# Patient Record
Sex: Female | Born: 1965
Health system: Southern US, Community
[De-identification: ages and names within clinical notes are randomized; demographics above are authoritative.]

## PROBLEM LIST (undated history)

## (undated) DIAGNOSIS — I639 Cerebral infarction, unspecified: Secondary | ICD-10-CM

## (undated) DIAGNOSIS — F419 Anxiety disorder, unspecified: Secondary | ICD-10-CM

## (undated) DIAGNOSIS — F32A Depression, unspecified: Secondary | ICD-10-CM

## (undated) DIAGNOSIS — R519 Headache, unspecified: Secondary | ICD-10-CM

## (undated) DIAGNOSIS — J189 Pneumonia, unspecified organism: Secondary | ICD-10-CM

## (undated) DIAGNOSIS — M1990 Unspecified osteoarthritis, unspecified site: Secondary | ICD-10-CM

## (undated) DIAGNOSIS — F329 Major depressive disorder, single episode, unspecified: Secondary | ICD-10-CM

## (undated) DIAGNOSIS — E119 Type 2 diabetes mellitus without complications: Secondary | ICD-10-CM

## (undated) DIAGNOSIS — R51 Headache: Secondary | ICD-10-CM

## (undated) HISTORY — DX: Type 2 diabetes mellitus without complications: E11.9

## (undated) HISTORY — PX: TUBAL LIGATION: SHX77

## (undated) HISTORY — PX: TONSILLECTOMY: SUR1361

---

## 2003-04-22 ENCOUNTER — Ambulatory Visit (HOSPITAL_COMMUNITY): Admission: RE | Admit: 2003-04-22 | Discharge: 2003-04-22 | Payer: Self-pay | Admitting: Obstetrics and Gynecology

## 2003-04-29 ENCOUNTER — Encounter: Admission: RE | Admit: 2003-04-29 | Discharge: 2003-04-29 | Payer: Self-pay | Admitting: Obstetrics & Gynecology

## 2003-07-06 ENCOUNTER — Inpatient Hospital Stay (HOSPITAL_COMMUNITY): Admission: AD | Admit: 2003-07-06 | Discharge: 2003-07-09 | Payer: Self-pay | Admitting: Obstetrics & Gynecology

## 2004-01-13 ENCOUNTER — Other Ambulatory Visit: Admission: RE | Admit: 2004-01-13 | Discharge: 2004-01-13 | Payer: Self-pay | Admitting: Obstetrics & Gynecology

## 2004-05-30 ENCOUNTER — Other Ambulatory Visit: Admission: RE | Admit: 2004-05-30 | Discharge: 2004-05-30 | Payer: Self-pay | Admitting: Obstetrics & Gynecology

## 2012-09-09 ENCOUNTER — Other Ambulatory Visit: Payer: Self-pay | Admitting: Obstetrics & Gynecology

## 2013-11-09 ENCOUNTER — Other Ambulatory Visit: Payer: Self-pay | Admitting: Obstetrics & Gynecology

## 2014-03-04 ENCOUNTER — Ambulatory Visit (INDEPENDENT_AMBULATORY_CARE_PROVIDER_SITE_OTHER): Payer: BC Managed Care – PPO

## 2014-03-04 VITALS — BP 118/67 | HR 84 | Resp 18

## 2014-03-04 DIAGNOSIS — E1140 Type 2 diabetes mellitus with diabetic neuropathy, unspecified: Secondary | ICD-10-CM

## 2014-03-04 DIAGNOSIS — M722 Plantar fascial fibromatosis: Secondary | ICD-10-CM

## 2014-03-04 DIAGNOSIS — E1149 Type 2 diabetes mellitus with other diabetic neurological complication: Secondary | ICD-10-CM

## 2014-03-04 DIAGNOSIS — G5790 Unspecified mononeuropathy of unspecified lower limb: Secondary | ICD-10-CM

## 2014-03-04 DIAGNOSIS — R52 Pain, unspecified: Secondary | ICD-10-CM

## 2014-03-04 DIAGNOSIS — M7730 Calcaneal spur, unspecified foot: Secondary | ICD-10-CM

## 2014-03-04 DIAGNOSIS — E1142 Type 2 diabetes mellitus with diabetic polyneuropathy: Secondary | ICD-10-CM

## 2014-03-04 MED ORDER — GABAPENTIN 300 MG PO CAPS: 300.0000 mg | ORAL_CAPSULE | Freq: Every day | ORAL | Status: DC

## 2014-03-04 MED ORDER — MELOXICAM 15 MG PO TABS: 15.0000 mg | ORAL_TABLET | Freq: Every day | ORAL | Status: DC

## 2014-03-04 NOTE — Patient Instructions (Signed)
ICE INSTRUCTIONS  Apply ice or cold pack to the affected area at least 3 times a day for 10-15 minutes each time.  You should also use ice after prolonged activity or vigorous exercise.  Do not apply ice longer than 20 minutes at one time.  Always keep a cloth between your skin and the ice pack to prevent burns.  Being consistent and following these instructions will help control your symptoms.  We suggest you purchase a gel ice pack because they are reusable and do bit leak.  Some of them are designed to wrap around the area.  Use the method that works best for you.  Here are some other suggestions for icing.   Use a frozen bag of peas or corn-inexpensive and molds well to your body, usually stays frozen for 10 to 20 minutes.  Wet a towel with cold water and squeeze out the excess until it's damp.  Place in a bag in the freezer for 20 minutes. Then remove and use.  Alternate applying warm or hot compress ice pack 10-15 minutes each going back-and-forth 2 or 3 times

## 2014-03-04 NOTE — Progress Notes (Signed)
   Subjective:    Patient ID: Cassandra Schmidt, female    DOB: 1966-03-19, 48 y.o.   MRN: [id number]  HPI right foot hurts on top and has been going on for about a month and hurts with shoes and on left foot and 3rd toe and has been going on for about a month and burns and throbs and hurts am and walks 2 miles    Review of Systems  Musculoskeletal: Positive for gait problem.  All other systems reviewed and are negative.      Objective:   Physical Exam 48 year old white female well-developed well-nourished returns to vital signs stable patient presents with complaints of pain in both feet patient having procedure sharp shooting pain points to the first metatarsal base dorsal medial on the right foot exquisitely painful and tender on palpation with shooting sensation she also shoe sensation to her left foot second and third toe area at times it just happened while she is sitting doing nothing patient does have a 10 year history of non-insulin-dependent diabetes. Patient was last 2 years and controlling her diabetes but I diet utilizing a vegan diet. Prior to that was on metformin. Currently taking no other medications. Last A1c is were in the 5 patient apparently is well managed. There is pain on palpation mid band plantar fascia medial calcaneal tubercle area on the left foot x-rays reveal well-developed inferior retrocalcaneal spurring of the left foot no spurring of the right foot no signs of fracture no osseous abnormalities rectus foot type noted so long first metatarsal noted on the right foot. Joint spaces are normal no cysts spurs or or tumors or identified. Vascular status is intact with pedal pulses palpable DP and PT posterior were for capillary refill time 3 seconds all digits epicritic and progressive sensations intact however significant hyperesthesia noted on the right foot and left foot on palpation and percussion the forefoot most particular long first metatarsal base right.         Assessment & Plan:  Assessment this time is likely diabetes with profound peripheral neuropathy and symmetric for while causing some paresthesia and neuritis. Patient also has findings consistent with plantar fasciitis affecting the left foot plan at this time fascial strapping applied left foot [facility] and for gabapentin are dispensed at this time patient will also recommend warm compress ice pack to both feet the inferior heel left medial midfoot right. Also patient wearing a pair sketchers or [employer] shoes are extremely broken down and hyperflexible recommend a more stable shoe no barefoot or flimsy shoes or flip-flops reappointed in 2-3 weeks for followup reevaluation may be K. for orthoses may be K. for adjustments of her Neurontin possibly the steroid injections if no improvement.  Alvan Dameichard Jon Kasparek DPM

## 2014-03-18 ENCOUNTER — Ambulatory Visit (INDEPENDENT_AMBULATORY_CARE_PROVIDER_SITE_OTHER): Payer: BC Managed Care – PPO

## 2014-03-18 DIAGNOSIS — M7730 Calcaneal spur, unspecified foot: Secondary | ICD-10-CM

## 2014-03-18 DIAGNOSIS — R52 Pain, unspecified: Secondary | ICD-10-CM

## 2014-03-18 DIAGNOSIS — G5790 Unspecified mononeuropathy of unspecified lower limb: Secondary | ICD-10-CM

## 2014-03-18 DIAGNOSIS — M722 Plantar fascial fibromatosis: Secondary | ICD-10-CM

## 2014-03-18 NOTE — Progress Notes (Signed)
   Subjective:    Patient ID: Cassandra Schmidt, female    DOB: 1965-12-24, 48 y.o.   MRN: [id number]  HPI my heels are much better and the taping did help and the right is doing good and the left was and then it started hurting again    Review of Systems no new systemic changes or findings noted     Objective:   Physical Exam 48 year old the options this time for followup of plantar fasciitis pain of both feet left foot more plantar fascial pain the right foot had Neurontin neurologic type pain over the first met base and cuneiform articulation site both feet have improved the Neurontin or gabapentin is definitely helped with the nerve pain in the right foot which is subside a significant issue sleeping well however the left foot had improvement with the fascia taping in place and use of meloxicam however the last 2 or 3 days the pain has recurred again. Patient is obtaining some better shoes tomatoes through mail order all recommend she patient at this time is placed back in a fascial strapping and we'll make arrangements for functional orthoses      Assessment & Plan:  Assessment plantar fasciitis/heel spur syndrome left foot respond to the fascial strapping should benefit from new functional orthotics orthotic casting will be scheduled her convenience we'll obtain appropriate insurance authorizations for orthoses also on the right foot in diabetic neuropathy and neuralgia neuritis situation to respond to the gabapentin will continue gabapentin is needed 3 mg each bedtime followup in the future once orthotics ready for fitting and dispensing.  Harriet Masson DPM

## 2014-03-18 NOTE — Patient Instructions (Signed)

## 2014-10-25 ENCOUNTER — Other Ambulatory Visit: Payer: Self-pay | Admitting: Obstetrics & Gynecology

## 2014-10-26 LAB — CYTOLOGY - PAP

## 2015-11-16 ENCOUNTER — Other Ambulatory Visit (HOSPITAL_COMMUNITY): Payer: Self-pay | Admitting: Interventional Radiology

## 2015-11-16 DIAGNOSIS — I671 Cerebral aneurysm, nonruptured: Secondary | ICD-10-CM

## 2015-12-20 ENCOUNTER — Other Ambulatory Visit (HOSPITAL_COMMUNITY): Payer: Self-pay | Admitting: Interventional Radiology

## 2015-12-20 ENCOUNTER — Ambulatory Visit (HOSPITAL_COMMUNITY)
Admission: RE | Admit: 2015-12-20 | Discharge: 2015-12-20 | Disposition: A | Discharge status: Home / Self Care | Payer: BLUE CROSS/BLUE SHIELD | Source: Ambulatory Visit | Attending: Interventional Radiology | Admitting: Interventional Radiology

## 2015-12-20 DIAGNOSIS — I671 Cerebral aneurysm, nonruptured: Secondary | ICD-10-CM

## 2015-12-20 DIAGNOSIS — I6389 Other cerebral infarction: Secondary | ICD-10-CM

## 2015-12-20 DIAGNOSIS — I729 Aneurysm of unspecified site: Secondary | ICD-10-CM

## 2015-12-29 ENCOUNTER — Other Ambulatory Visit: Payer: Self-pay | Admitting: Physician Assistant

## 2015-12-29 ENCOUNTER — Other Ambulatory Visit: Payer: Self-pay | Admitting: Radiology

## 2015-12-30 ENCOUNTER — Ambulatory Visit (HOSPITAL_COMMUNITY)
Admission: RE | Admit: 2015-12-30 | Discharge: 2015-12-30 | Disposition: A | Discharge status: Home / Self Care | Payer: BLUE CROSS/BLUE SHIELD | Source: Ambulatory Visit | Attending: Interventional Radiology | Admitting: Interventional Radiology

## 2015-12-30 ENCOUNTER — Encounter (HOSPITAL_COMMUNITY): Payer: Self-pay

## 2015-12-30 ENCOUNTER — Other Ambulatory Visit (HOSPITAL_COMMUNITY): Payer: Self-pay | Admitting: Interventional Radiology

## 2015-12-30 DIAGNOSIS — I6389 Other cerebral infarction: Secondary | ICD-10-CM

## 2015-12-30 DIAGNOSIS — I671 Cerebral aneurysm, nonruptured: Secondary | ICD-10-CM | POA: Diagnosis present

## 2015-12-30 DIAGNOSIS — E119 Type 2 diabetes mellitus without complications: Secondary | ICD-10-CM | POA: Insufficient documentation

## 2015-12-30 DIAGNOSIS — Z7984 Long term (current) use of oral hypoglycemic drugs: Secondary | ICD-10-CM | POA: Diagnosis not present

## 2015-12-30 DIAGNOSIS — Q282 Arteriovenous malformation of cerebral vessels: Secondary | ICD-10-CM | POA: Insufficient documentation

## 2015-12-30 DIAGNOSIS — Z880 Allergy status to penicillin: Secondary | ICD-10-CM | POA: Insufficient documentation

## 2015-12-30 DIAGNOSIS — I729 Aneurysm of unspecified site: Secondary | ICD-10-CM

## 2015-12-30 LAB — CBC
HCT: 35.9 % — ABNORMAL LOW (ref 36.0–46.0)
HEMOGLOBIN: 11.1 g/dL — AB (ref 12.0–15.0)
MCH: 24.1 pg — AB (ref 26.0–34.0)
MCHC: 30.9 g/dL (ref 30.0–36.0)
MCV: 78 fL (ref 78.0–100.0)
PLATELETS: 266 10*3/uL (ref 150–400)
RBC: 4.6 MIL/uL (ref 3.87–5.11)
RDW: 16.8 % — AB (ref 11.5–15.5)
WBC: 6.3 10*3/uL (ref 4.0–10.5)

## 2015-12-30 LAB — BASIC METABOLIC PANEL
ANION GAP: 10 (ref 5–15)
BUN: 7 mg/dL (ref 6–20)
CALCIUM: 9.2 mg/dL (ref 8.9–10.3)
CO2: 24 mmol/L (ref 22–32)
CREATININE: 0.58 mg/dL (ref 0.44–1.00)
Chloride: 105 mmol/L (ref 101–111)
GFR calc Af Amer: >60 mL/min (ref >60)
GLUCOSE: 119 mg/dL — AB (ref 65–99)
Potassium: 3.7 mmol/L (ref 3.5–5.1)
Sodium: 139 mmol/L (ref 135–145)

## 2015-12-30 LAB — PROTIME-INR
INR: 1.03 (ref 0.00–1.49)
PROTHROMBIN TIME: 13.7 s (ref 11.6–15.2)

## 2015-12-30 LAB — GLUCOSE, CAPILLARY
GLUCOSE-CAPILLARY: 128 mg/dL — AB (ref 65–99)
Glucose-Capillary: 74 mg/dL (ref 65–99)

## 2015-12-30 LAB — APTT: APTT: 26 s (ref 24–37)

## 2015-12-30 MED ORDER — HEPARIN SOD (PORK) LOCK FLUSH 100 UNIT/ML IV SOLN
INTRAVENOUS | Status: AC
  Filled 2015-12-30: qty 10

## 2015-12-30 MED ORDER — HEPARIN SODIUM (PORCINE) 1000 UNIT/ML IJ SOLN
INTRAMUSCULAR | Status: AC | PRN
  Administered 2015-12-30: 1000 [IU] via INTRAVENOUS

## 2015-12-30 MED ORDER — FENTANYL CITRATE (PF) 100 MCG/2ML IJ SOLN
INTRAMUSCULAR | Status: AC | PRN
  Administered 2015-12-30: 12.5 ug via INTRAVENOUS
  Administered 2015-12-30: 25 ug via INTRAVENOUS

## 2015-12-30 MED ORDER — FENTANYL CITRATE (PF) 100 MCG/2ML IJ SOLN
INTRAMUSCULAR | Status: AC
  Filled 2015-12-30: qty 2

## 2015-12-30 MED ORDER — MIDAZOLAM HCL 2 MG/2ML IJ SOLN
INTRAMUSCULAR | Status: AC
  Filled 2015-12-30: qty 2

## 2015-12-30 MED ORDER — MIDAZOLAM HCL 2 MG/2ML IJ SOLN
INTRAMUSCULAR | Status: AC | PRN
  Administered 2015-12-30: 0.5 mg via INTRAVENOUS
  Administered 2015-12-30: 1 mg via INTRAVENOUS

## 2015-12-30 MED ORDER — SODIUM CHLORIDE 0.9 % IV SOLN: INTRAVENOUS | Status: AC

## 2015-12-30 MED ORDER — SODIUM CHLORIDE 0.9 % IV SOLN
INTRAVENOUS | Status: DC
  Administered 2015-12-30: 09:00:00 via INTRAVENOUS

## 2015-12-30 MED ORDER — LIDOCAINE HCL 1 % IJ SOLN
INTRAMUSCULAR | Status: AC
  Filled 2015-12-30: qty 20

## 2015-12-30 NOTE — Sedation Documentation (Signed)
Patient denies pain and is resting comfortably.  

## 2015-12-30 NOTE — Procedures (Signed)
S/P 4 vessel cerebral arteriogram with 3D rotational angiogram. Rt CFA approach. Findings. 1.Appro 2mm x 2.2 mm ACOM aneurysm

## 2015-12-30 NOTE — Sedation Documentation (Signed)
23fr. Sheath removed from right fem artery by R. Abran Cantor, RTR. Hemostasis achieved using exoseal closure device and manual pressure for 10 minutes. Groin level 0, RPT +3,. RDP +3.

## 2015-12-30 NOTE — H&P (Signed)
Chief Complaint: "I'm here for an angiogram"  HPI: Cassandra Schmidt is an 50 y.o. female with some recent left sided neurologic sxs and an MRI found possible left sided intracranial aneurysm. She has seen Dr. Estanislado Pandy and is now scheduled for cerebral arteriogram. PMHx, meds, labs, allergies reviewed. Has been NPO this am No new c/o, recent fevers, chills, illness   Past Medical History:  Past Medical History  Diagnosis Date  . Diabetes mellitus without complication [facility])     Past Surgical History:  Past Surgical History  Procedure Laterality Date  . Cesarean section      Family History: History reviewed. No pertinent family history.  Social History:  reports that she has never smoked. She has never used smokeless tobacco. She reports that she does not drink alcohol or use illicit drugs.  Allergies:  Allergies  Allergen Reactions  . Keflex [Cephalexin] Anaphylaxis    Throat swelling  . Penicillins Itching and Rash    Has patient had a PCN reaction causing immediate rash, facial/tongue/throat swelling, SOB or lightheadedness with hypotension:  Unknown Has patient had a PCN reaction causing severe rash involving mucus membranes or skin necrosis: No Has patient had a PCN reaction that required hospitalization No Has patient had a PCN reaction occurring within the last 10 years: No If all of the above answers are "NO", then may proceed with Cephalosporin use.     Medications:   Medication List    ASK your doctor about these medications        Black Cohosh 540 MG Caps  Take 1 capsule by mouth daily.     CINNAMON PLUS CHROMIUM [phone] MCG-MG Caps  Generic drug:  Chromium-Cinnamon  Take 1,000 mcg by mouth 2 (two) times daily.     gabapentin 300 MG capsule  Commonly known as:  NEURONTIN  Take 1 capsule (300 mg total) by mouth at bedtime.     Garcinia Cambogia-Chromium 500-200 MG-MCG Tabs  Take 2 tablets by mouth 2 (two) times daily.     glipiZIDE 5 MG 24 hr tablet   Commonly known as:  GLUCOTROL XL  Take 5 mg by mouth at bedtime.     meloxicam 15 MG tablet  Commonly known as:  MOBIC  Take 1 tablet (15 mg total) by mouth daily.     metFORMIN 500 MG tablet  Commonly known as:  GLUCOPHAGE  Take 1,000 mg by mouth 2 (two) times daily with a meal.     sitaGLIPtin 100 MG tablet  Commonly known as:  JANUVIA  Take 100 mg by mouth daily.     STRESS B COMPLEX PO  Take 1 tablet by mouth daily.     Vitamin D-3 1000 units Caps  Take 1 capsule by mouth daily.        Please HPI for pertinent positives, otherwise complete 10 system ROS negative.  Physical Exam: BP 114/71 mmHg  Pulse 79  Temp(Src) 97.7 F (36.5 C) (Oral)  Resp 18  Ht _0  (1.6 m)  Wt 146 lb (66.225 kg)  BMI 25.87 kg/m2  SpO2 100%  LMP 12/05/2015 Body mass index is 25.87 kg/(m^2).   General Appearance:  Alert, cooperative, no distress, appears stated age  Head:  Normocephalic, without obvious abnormality, atraumatic  ENT: Unremarkable  Neck: Supple, symmetrical, trachea midline  Lungs:   Clear to auscultation bilaterally, no w/r/r, respirations unlabored without use of accessory muscles.  Chest Wall:  No tenderness or deformity  Heart:  Regular rate and rhythm, S1,  S2 normal, no murmur, rub or gallop.  Abdomen:   Soft, non-tender, non distended.  Extremities: Extremities normal, atraumatic, no cyanosis or edema  Pulses: 2+ and symmetric femoral  Neurologic: Normal affect, no gross deficits.  Labs: Results for orders placed or performed during the hospital encounter of 12/30/15 (from the past 48 hour(s))  Glucose, capillary     Status: Abnormal   Collection Time: 12/30/15  7:26 AM  Result Value Ref Range   Glucose-Capillary 128 (H) 65 - 99 mg/dL  APTT     Status: None   Collection Time: 12/30/15  8:18 AM  Result Value Ref Range   aPTT 26 24 - 37 seconds  Basic metabolic panel     Status: Abnormal   Collection Time: 12/30/15  8:18 AM  Result Value Ref Range   Sodium  139 135 - 145 mmol/L   Potassium 3.7 3.5 - 5.1 mmol/L   Chloride 105 101 - 111 mmol/L   CO2 24 22 - 32 mmol/L   Glucose, Bld 119 (H) 65 - 99 mg/dL   BUN 7 6 - 20 mg/dL   Creatinine, Ser 0.58 0.44 - 1.00 mg/dL   Calcium 9.2 8.9 - 10.3 mg/dL   GFR calc non Af Amer >60 >60 mL/min   GFR calc Af Amer >60 >60 mL/min    Comment: (NOTE) The eGFR has been calculated using the CKD EPI equation. This calculation has not been validated in all clinical situations. eGFR's persistently <60 mL/min signify possible Chronic Kidney Disease.    Anion gap 10 5 - 15  CBC     Status: Abnormal   Collection Time: 12/30/15  8:18 AM  Result Value Ref Range   WBC 6.3 4.0 - 10.5 K/uL   RBC 4.60 3.87 - 5.11 MIL/uL   Hemoglobin 11.1 (L) 12.0 - 15.0 g/dL   HCT 35.9 (L) 36.0 - 46.0 %   MCV 78.0 78.0 - 100.0 fL   MCH 24.1 (L) 26.0 - 34.0 pg   MCHC 30.9 30.0 - 36.0 g/dL   RDW 16.8 (H) 11.5 - 15.5 %   Platelets 266 150 - 400 K/uL  Protime-INR     Status: None   Collection Time: 12/30/15  8:18 AM  Result Value Ref Range   Prothrombin Time 13.7 11.6 - 15.2 seconds   INR 1.03 0.00 - 1.49    Imaging: No results found.  Assessment/Plan Intracranial aneurysm seen on MRI For cerebral arteriogram today Labs ok Risks and Benefits discussed with the patient including, but not limited to bleeding, infection, vascular injury or contrast induced renal failure. All of the patient's questions were answered, patient is agreeable to proceed. Consent signed and in chart.   Ascencion Dike PA-C 12/30/2015, 9:29 AM

## 2015-12-30 NOTE — Discharge Instructions (Signed)
Angiogram, Care After °Refer to this sheet in the next few weeks. These instructions provide you with information about caring for yourself after your procedure. Your health care provider may also give you more specific instructions. Your treatment has been planned according to current medical practices, but problems sometimes occur. Call your health care provider if you have any problems or questions after your procedure. °WHAT TO EXPECT AFTER THE PROCEDURE °After your procedure, it is typical to have the following: °· Bruising at the catheter insertion site that usually fades within 1-2 weeks. °· Blood collecting in the tissue (hematoma) that may be painful to the touch. It should usually decrease in size and tenderness within 1-2 weeks. °HOME CARE INSTRUCTIONS °· Take medicines only as directed by your health care provider. °· You may shower 24-48 hours after the procedure or as directed by your health care provider. Remove the bandage (dressing) and gently wash the site with plain soap and water. Pat the area dry with a clean towel. Do not rub the site, because this may cause bleeding. °· Do not take baths, swim, or use a hot tub until your health care provider approves. °· Check your insertion site every day for redness, swelling, or drainage. °· Do not apply powder or lotion to the site. °· Do not lift over 10 lb (4.5 kg) for 5 days after your procedure or as directed by your health care provider. °· Ask your health care provider when it is okay to: °¨ Return to work or school. °¨ Resume usual physical activities or sports. °¨ Resume sexual activity. °· Do not drive home if you are discharged the same day as the procedure. Have someone else drive you. °· You may drive 24 hours after the procedure unless otherwise instructed by your health care provider. °· Do not operate machinery or power tools for 24 hours after the procedure or as directed by your health care provider. °· If your procedure was done as an  outpatient procedure, which means that you went home the same day as your procedure, a responsible adult should be with you for the first 24 hours after you arrive home. °· Keep all follow-up visits as directed by your health care provider. This is important. °SEEK MEDICAL CARE IF: °· You have a fever. °· You have chills. °· You have increased bleeding from the catheter insertion site. Hold pressure on the site.  CALL 911 °SEEK IMMEDIATE MEDICAL CARE IF: °· You have unusual pain at the catheter insertion site. °· You have redness, warmth, or swelling at the catheter insertion site. °· You have drainage (other than a small amount of blood on the dressing) from the catheter insertion site. °· The catheter insertion site is bleeding, and the bleeding does not stop after 30 minutes of holding steady pressure on the site. °· The area near or just beyond the catheter insertion site becomes pale, cool, tingly, or numb. °  °This information is not intended to replace advice given to you by your health care provider. Make sure you discuss any questions you have with your health care provider. °  °Document Released: 05/31/2005 Document Revised: 12/03/2014 Document Reviewed: 04/15/2013 °Elsevier Interactive Patient Education ©2016 Elsevier Inc. ° °

## 2016-01-06 ENCOUNTER — Ambulatory Visit (HOSPITAL_COMMUNITY)
Admission: RE | Admit: 2016-01-06 | Discharge: 2016-01-06 | Disposition: A | Discharge status: Home / Self Care | Payer: BLUE CROSS/BLUE SHIELD | Source: Ambulatory Visit | Attending: Interventional Radiology | Admitting: Interventional Radiology

## 2016-01-06 ENCOUNTER — Other Ambulatory Visit (HOSPITAL_COMMUNITY): Payer: Self-pay | Admitting: Interventional Radiology

## 2016-01-06 ENCOUNTER — Ambulatory Visit (HOSPITAL_COMMUNITY): Payer: BLUE CROSS/BLUE SHIELD

## 2016-01-06 ENCOUNTER — Ambulatory Visit (HOSPITAL_COMMUNITY): Admission: RE | Admit: 2016-01-06 | Payer: BLUE CROSS/BLUE SHIELD | Source: Ambulatory Visit

## 2016-01-06 DIAGNOSIS — I6389 Other cerebral infarction: Secondary | ICD-10-CM

## 2016-01-06 DIAGNOSIS — I671 Cerebral aneurysm, nonruptured: Secondary | ICD-10-CM

## 2016-01-27 ENCOUNTER — Other Ambulatory Visit: Payer: Self-pay | Admitting: Radiology

## 2016-02-01 ENCOUNTER — Encounter (HOSPITAL_COMMUNITY)
Admission: RE | Admit: 2016-02-01 | Discharge: 2016-02-01 | Disposition: A | Discharge status: Home / Self Care | Payer: BLUE CROSS/BLUE SHIELD | Source: Ambulatory Visit | Attending: Interventional Radiology | Admitting: Interventional Radiology

## 2016-02-01 ENCOUNTER — Encounter (HOSPITAL_COMMUNITY): Payer: Self-pay

## 2016-02-01 DIAGNOSIS — Z01812 Encounter for preprocedural laboratory examination: Secondary | ICD-10-CM | POA: Diagnosis not present

## 2016-02-01 DIAGNOSIS — I671 Cerebral aneurysm, nonruptured: Secondary | ICD-10-CM | POA: Diagnosis not present

## 2016-02-01 DIAGNOSIS — Z01818 Encounter for other preprocedural examination: Secondary | ICD-10-CM | POA: Insufficient documentation

## 2016-02-01 DIAGNOSIS — Z8673 Personal history of transient ischemic attack (TIA), and cerebral infarction without residual deficits: Secondary | ICD-10-CM | POA: Diagnosis not present

## 2016-02-01 DIAGNOSIS — Z7984 Long term (current) use of oral hypoglycemic drugs: Secondary | ICD-10-CM | POA: Insufficient documentation

## 2016-02-01 DIAGNOSIS — Z7982 Long term (current) use of aspirin: Secondary | ICD-10-CM | POA: Diagnosis not present

## 2016-02-01 DIAGNOSIS — E119 Type 2 diabetes mellitus without complications: Secondary | ICD-10-CM | POA: Diagnosis not present

## 2016-02-01 HISTORY — DX: Headache, unspecified: R51.9

## 2016-02-01 HISTORY — DX: Anxiety disorder, unspecified: F41.9

## 2016-02-01 HISTORY — DX: Depression, unspecified: F32.A

## 2016-02-01 HISTORY — DX: Headache: R51

## 2016-02-01 HISTORY — DX: Major depressive disorder, single episode, unspecified: F32.9

## 2016-02-01 HISTORY — DX: Cerebral infarction, unspecified: I63.9

## 2016-02-01 HISTORY — DX: Unspecified osteoarthritis, unspecified site: M19.90

## 2016-02-01 LAB — CBC WITH DIFFERENTIAL/PLATELET
BASOS ABS: 0 10*3/uL (ref 0.0–0.1)
BASOS PCT: 1 %
EOS ABS: 0.1 10*3/uL (ref 0.0–0.7)
EOS PCT: 2 %
HCT: 32.9 % — ABNORMAL LOW (ref 36.0–46.0)
HEMOGLOBIN: 10.3 g/dL — AB (ref 12.0–15.0)
LYMPHS ABS: 1.7 10*3/uL (ref 0.7–4.0)
Lymphocytes Relative: 31 %
MCH: 24.3 pg — ABNORMAL LOW (ref 26.0–34.0)
MCHC: 31.3 g/dL (ref 30.0–36.0)
MCV: 77.8 fL — AB (ref 78.0–100.0)
Monocytes Absolute: 0.3 10*3/uL (ref 0.1–1.0)
Monocytes Relative: 5 %
NEUTROS PCT: 61 %
Neutro Abs: 3.4 10*3/uL (ref 1.7–7.7)
PLATELETS: 303 10*3/uL (ref 150–400)
RBC: 4.23 MIL/uL (ref 3.87–5.11)
RDW: 16 % — ABNORMAL HIGH (ref 11.5–15.5)
WBC: 5.5 10*3/uL (ref 4.0–10.5)

## 2016-02-01 LAB — COMPREHENSIVE METABOLIC PANEL
ALBUMIN: 3.7 g/dL (ref 3.5–5.0)
ALT: 16 U/L (ref 14–54)
AST: 23 U/L (ref 15–41)
Alkaline Phosphatase: 54 U/L (ref 38–126)
Anion gap: 11 (ref 5–15)
BILIRUBIN TOTAL: 0.3 mg/dL (ref 0.3–1.2)
BUN: 7 mg/dL (ref 6–20)
CHLORIDE: 103 mmol/L (ref 101–111)
CO2: 24 mmol/L (ref 22–32)
CREATININE: 0.6 mg/dL (ref 0.44–1.00)
Calcium: 9.4 mg/dL (ref 8.9–10.3)
GFR calc Af Amer: >60 mL/min (ref >60)
GLUCOSE: 137 mg/dL — AB (ref 65–99)
Potassium: 4.1 mmol/L (ref 3.5–5.1)
Sodium: 138 mmol/L (ref 135–145)
Total Protein: 7 g/dL (ref 6.5–8.1)

## 2016-02-01 LAB — HCG, SERUM, QUALITATIVE: Preg, Serum: NEGATIVE

## 2016-02-01 LAB — PROTIME-INR
INR: 1.01 (ref 0.00–1.49)
PROTHROMBIN TIME: 13.6 s (ref 11.6–15.2)

## 2016-02-01 LAB — GLUCOSE, CAPILLARY: Glucose-Capillary: 120 mg/dL — ABNORMAL HIGH (ref 65–99)

## 2016-02-01 LAB — APTT: APTT: 26 s (ref 24–37)

## 2016-02-01 NOTE — Pre-Procedure Instructions (Signed)
Brunetta JeansCarol J Worrell  02/01/2016      [facility] [address] [place], [place] - [id number] [place] [id number] Phone: [phone] Fax: [phone]    Your procedure is scheduled on March 13  [facility] Ad[facility] at [phone].M.  Call this number if you have problems the morning of surgery:  [phone]   Remember:  Do not eat food or drink liquids after midnight.  Take these medicines the morning of surgery with A SIP OF WATER aspirin, plavix  Stop taking herbal medication, Fish Oil, Vitamins, Ibuprofen, Advil, Motrin, Aleve, BC's, Goody's How to Manage Your Diabetes Before Surgery   Why is it important to control my blood sugar before and after surgery?   Improving blood sugar levels before and after surgery helps healing and can limit problems.  A way of improving blood sugar control is eating a healthy diet by:  - Eating less sugar and carbohydrates  - Increasing activity/exercise  - Talk with your doctor about reaching your blood sugar goals  High blood sugars (greater than 180 mg/dL) can raise your risk of infections and slow down your recovery so you will need to focus on controlling your diabetes during the weeks before surgery.  Make sure that the doctor who takes care of your diabetes knows about your planned surgery including the date and location.  How do I manage my blood sugars before surgery?   Check your blood sugar at least 4 times a day, 2 days before surgery to make sure that they are not too high or low.   Check your blood sugar the morning of your surgery when you wake up and every 2               hours until you get to the Short-Stay unit.  If your blood sugar is less than 70 mg/dL, you will need to treat for low blood sugar by:  Treat a low blood sugar (less than 70 mg/dL) with 1/2 cup of clear juice (cranberry or apple), 4 glucose tablets, OR glucose gel.  Recheck blood sugar in 15 minutes after  treatment (to make sure it is greater than 70 mg/dL).  If blood sugar is not greater than 70 mg/dL on re-check, call [phone] for further instructions.   Report your blood sugar to the Short-Stay nurse when you get to Short-Stay.  References:  [facility], 2007 "How to Manage your Diabetes Before and After Surgery".  What do I do about my diabetes medications?   Do not take oral diabetes medicines (pills) the morning of surgery. Glipizide (Glucotrol), metformin (Glumetza), Sitagliptin (Januvia)    Do not wear jewelry, make-up or nail polish.  Do not wear lotions, powders, or perfumes.  You may wear deodorant.  Do not shave 48 hours prior to surgery.  Men may shave face and neck.  Do not bring valuables to the hospital.  [facility] is not responsible for any belongings or valuables.  Contacts, dentures or bridgework may not be worn into surgery.  Leave your suitcase in the car.  After surgery it may be brought to your room.  For patients admitted to the hospital, discharge time will be determined by your treatment team.  Patients discharged the day of surgery will not be allowed to drive home.    Name and phone number of your driver:    Special instructions:  [place] - Preparing for Surgery  Before surgery, you can play an important role.  Because skin is not sterile, your skin needs to be as free of germs as possible.  You can reduce the number of germs on you skin by washing with CHG (chlorahexidine gluconate) soap before surgery.  CHG is an antiseptic cleaner which kills germs and bonds with the skin to continue killing germs even after washing.  Please DO NOT use if you have an allergy to CHG or antibacterial soaps.  If your skin becomes reddened/irritated stop using the CHG and inform your nurse when you arrive at Short Stay.  Do not shave (including legs and underarms) for at least 48 hours prior to the first CHG shower.  You may shave your  face.  Please follow these instructions carefully:   1.  Shower with CHG Soap the night before surgery and the     morning of Surgery.  2.  If you choose to wash your hair, wash your hair first as usual with your normal shampoo.  3.  After you shampoo, rinse your hair and body thoroughly to remove the     Shampoo.  4.  Use CHG as you would any other liquid soap.  You can apply chg directly       to the skin and wash gently with scrungie or a clean washcloth.  5.  Apply the CHG Soap to your body ONLY FROM THE NECK DOWN.        Do not use on open wounds or open sores.  Avoid contact with your eyes,       ears, mouth and genitals (private parts).  Wash genitals (private parts)   with your normal soap.  6.  Wash thoroughly, paying special attention to the area where your surgery        will be performed.  7.  Thoroughly rinse your body with warm water from the neck down.  8.  DO NOT shower/wash with your normal soap after using and rinsing off  the CHG Soap.  9.  Pat yourself dry with a clean towel.            10.  Wear clean pajamas.            11.  Place clean sheets on your bed the night of your first shower and do not sleep with pets.  Day of Surgery  Do not apply any lotions/deoderants the morning of surgery.  Please wear clean clothes to the hospital/surgery center.     Please read over the following fact sheets that you were given. Pain Booklet, Coughing and Deep Breathing and Surgical Site Infection Prevention

## 2016-02-01 NOTE — Progress Notes (Signed)
PCP is Dr Leonia ReaderVan EYK Denies ever seeing a cardiologist Reports her fasting CBG's run 100-150's Pt has not started her Plavix yet will start today. P2y12 to be drawn the day of surgery

## 2016-02-02 ENCOUNTER — Ambulatory Visit (HOSPITAL_COMMUNITY): Payer: BLUE CROSS/BLUE SHIELD | Admitting: Emergency Medicine

## 2016-02-02 ENCOUNTER — Ambulatory Visit (HOSPITAL_COMMUNITY): Payer: BLUE CROSS/BLUE SHIELD | Admitting: Anesthesiology

## 2016-02-02 LAB — HEMOGLOBIN A1C
Hgb A1c MFr Bld: 6.3 % — ABNORMAL HIGH (ref 4.8–5.6)
Mean Plasma Glucose: 134 mg/dL

## 2016-02-02 NOTE — Progress Notes (Signed)
Anesthesia Chart Review:  Pt is a 50 year old female scheduled for cerebral arteriogram with anterior communicating artery aneurysm and dural AV fistula embolization on 02/06/2016 with Dr. Corliss Skainseveshwar.   PCP is Dr. Leonia ReaderVan Eyk.   PMH includes:  Stroke, DM. Never smoker. BMI 26  Medications include: ASA, plavix (to start 02/01/16), glipizide, metformin, sitagliptin  Preoperative labs reviewed.  HgbA1c 6.3, glucose 137  EKG not obtained at PAT. Will get DOS.   If EKG acceptable DOS, I anticipate pt can proceed as scheduled.   Rica Mastngela Fermina Mishkin, FNP-BC [facility]iology Phone: [phone] 02/02/2016 1:32 PM

## 2016-02-03 ENCOUNTER — Other Ambulatory Visit: Payer: Self-pay | Admitting: General Surgery

## 2016-02-05 NOTE — Anesthesia Preprocedure Evaluation (Deleted)
Anesthesia Evaluation  Patient identified by MRN, date of birth, ID band Patient awake    Reviewed: Allergy & Precautions, H&P , NPO status , Patient's Chart, lab work & pertinent test results  Airway Mallampati: I  TM Distance: >3 FB Neck ROM: Full    Dental no notable dental hx. (+) Upper Dentures, Lower Dentures, Dental Advisory Given   Pulmonary neg pulmonary ROS,    Pulmonary exam normal breath sounds clear to auscultation       Cardiovascular negative cardio ROS   Rhythm:Regular Rate:Normal     Neuro/Psych  Headaches, Anxiety Depression CVA, No Residual Symptoms    GI/Hepatic negative GI ROS, Neg liver ROS,   Endo/Other  diabetes, Type 2, Oral Hypoglycemic Agents  Renal/GU negative Renal ROS  negative genitourinary   Musculoskeletal  (+) Arthritis , Osteoarthritis,    Abdominal   Peds  Hematology negative hematology ROS (+)   Anesthesia Other Findings   Reproductive/Obstetrics negative OB ROS                            Anesthesia Physical Anesthesia Plan  ASA: III  Anesthesia Plan: General   Post-op Pain Management:    Induction: Intravenous  Airway Management Planned: Oral ETT  Additional Equipment: Arterial line  Intra-op Plan:   Post-operative Plan: Extubation in OR and Possible Post-op intubation/ventilation  Informed Consent: I have reviewed the patients History and Physical, chart, labs and discussed the procedure including the risks, benefits and alternatives for the proposed anesthesia with the patient or authorized representative who has indicated his/her understanding and acceptance.   Dental advisory given  Plan Discussed with: CRNA  Anesthesia Plan Comments:         Anesthesia Quick Evaluation

## 2016-02-06 ENCOUNTER — Encounter (HOSPITAL_COMMUNITY)
Admission: RE | Disposition: A | Discharge status: Home / Self Care | Payer: Self-pay | Source: Ambulatory Visit | Attending: Interventional Radiology

## 2016-02-06 ENCOUNTER — Ambulatory Visit (HOSPITAL_COMMUNITY)
Admission: RE | Admit: 2016-02-06 | Discharge: 2016-02-06 | Disposition: A | Discharge status: Home / Self Care | Payer: BLUE CROSS/BLUE SHIELD | Source: Ambulatory Visit | Attending: Interventional Radiology | Admitting: Interventional Radiology

## 2016-02-06 ENCOUNTER — Encounter (HOSPITAL_COMMUNITY): Payer: Self-pay | Admitting: *Deleted

## 2016-02-06 DIAGNOSIS — I671 Cerebral aneurysm, nonruptured: Secondary | ICD-10-CM

## 2016-02-06 DIAGNOSIS — Z794 Long term (current) use of insulin: Secondary | ICD-10-CM | POA: Insufficient documentation

## 2016-02-06 DIAGNOSIS — E119 Type 2 diabetes mellitus without complications: Secondary | ICD-10-CM | POA: Insufficient documentation

## 2016-02-06 DIAGNOSIS — Z538 Procedure and treatment not carried out for other reasons: Secondary | ICD-10-CM | POA: Diagnosis not present

## 2016-02-06 LAB — GLUCOSE, CAPILLARY: GLUCOSE-CAPILLARY: 111 mg/dL — AB (ref 65–99)

## 2016-02-06 LAB — PLATELET INHIBITION P2Y12: PLATELET FUNCTION P2Y12: 259 [PRU] (ref 194–418)

## 2016-02-06 SURGERY — RADIOLOGY WITH ANESTHESIA
Anesthesia: General

## 2016-02-06 MED ORDER — NIMODIPINE 30 MG PO CAPS
0.0000 mg | ORAL_CAPSULE | ORAL | Status: DC
  Filled 2016-02-06: qty 2

## 2016-02-06 MED ORDER — ASPIRIN 81 MG PO CHEW
CHEWABLE_TABLET | ORAL | Status: AC
  Administered 2016-02-06: 243 mg
  Filled 2016-02-06: qty 3

## 2016-02-06 MED ORDER — VANCOMYCIN HCL 1000 MG IV SOLR
1000.0000 mg | INTRAVENOUS | Status: DC
  Filled 2016-02-06: qty 1000

## 2016-02-06 MED ORDER — CLOPIDOGREL BISULFATE 75 MG PO TABS
75.0000 mg | ORAL_TABLET | ORAL | Status: DC
  Filled 2016-02-06: qty 1

## 2016-02-06 MED ORDER — VANCOMYCIN HCL IN DEXTROSE 1-5 GM/200ML-% IV SOLN
INTRAVENOUS | Status: AC
  Filled 2016-02-06: qty 200

## 2016-02-06 MED ORDER — ASPIRIN EC 325 MG PO TBEC
325.0000 mg | DELAYED_RELEASE_TABLET | ORAL | Status: DC
  Filled 2016-02-06: qty 1

## 2016-02-06 MED ORDER — SODIUM CHLORIDE 0.9 % IV SOLN: INTRAVENOUS | Status: DC

## 2016-02-06 NOTE — Progress Notes (Signed)
Patient ID: Brunetta JeansCarol J Schmidt, female   DOB: [birth year], 50 y.o.   MRN: [id number] Pt's P2Y12 is too high at 259.  It needs to be between 50-150.  She has been taking her plavix and ASA as prescribed.  She will start doubling her dose of plavix today and recheck a P2Y12 on Friday.  If this is in the expected range, then she will be reset up for a procedure hopefully next week.  [name] E 8:52 AM 02/06/2016

## 2016-02-10 ENCOUNTER — Other Ambulatory Visit: Payer: Self-pay | Admitting: Radiology

## 2016-02-10 LAB — PLATELET INHIBITION P2Y12: PLATELET FUNCTION P2Y12: 171 [PRU] — AB (ref 194–418)

## 2016-02-17 ENCOUNTER — Other Ambulatory Visit: Payer: Self-pay | Admitting: Radiology

## 2016-02-17 ENCOUNTER — Encounter (HOSPITAL_COMMUNITY): Payer: Self-pay | Admitting: *Deleted

## 2016-02-17 ENCOUNTER — Telehealth: Payer: Self-pay | Admitting: Radiology

## 2016-02-17 MED ORDER — HYDROMORPHONE HCL 1 MG/ML IJ SOLN: 0.2500 mg | INTRAMUSCULAR | Status: DC | PRN

## 2016-02-17 NOTE — Progress Notes (Signed)
Called patient to confirm pt taking Plavix 75mg  po BID as well as ASA 325mg  daily. Pt confirmed Scheduled for intervention with Dr. Corliss Skainseveshwar on 3/27 Called in Rx refill for Plavix 75mg  BID, #60, X 2 RF  Brayton ElKevin Mayzie Caughlin PA-C Interventional Radiology 02/17/2016 3:19 PM

## 2016-02-17 NOTE — Progress Notes (Signed)
Patient  was instructed by Dr Corliss Skainseveshwar to arrive  At 0630.

## 2016-02-20 ENCOUNTER — Ambulatory Visit (HOSPITAL_COMMUNITY)
Admission: RE | Admit: 2016-02-20 | Discharge: 2016-02-20 | Disposition: A | Discharge status: Home / Self Care | Payer: BLUE CROSS/BLUE SHIELD | Source: Ambulatory Visit | Attending: Interventional Radiology | Admitting: Interventional Radiology

## 2016-02-20 ENCOUNTER — Encounter (HOSPITAL_COMMUNITY)
Admission: RE | Disposition: A | Discharge status: Home / Self Care | Payer: Self-pay | Source: Ambulatory Visit | Attending: Interventional Radiology

## 2016-02-20 ENCOUNTER — Ambulatory Visit (HOSPITAL_COMMUNITY): Payer: BLUE CROSS/BLUE SHIELD | Admitting: Anesthesiology

## 2016-02-20 ENCOUNTER — Encounter (HOSPITAL_COMMUNITY): Payer: Self-pay | Admitting: Certified Registered Nurse Anesthetist

## 2016-02-20 DIAGNOSIS — Z7984 Long term (current) use of oral hypoglycemic drugs: Secondary | ICD-10-CM | POA: Insufficient documentation

## 2016-02-20 DIAGNOSIS — R799 Abnormal finding of blood chemistry, unspecified: Secondary | ICD-10-CM | POA: Diagnosis not present

## 2016-02-20 DIAGNOSIS — Z8673 Personal history of transient ischemic attack (TIA), and cerebral infarction without residual deficits: Secondary | ICD-10-CM | POA: Insufficient documentation

## 2016-02-20 DIAGNOSIS — I671 Cerebral aneurysm, nonruptured: Secondary | ICD-10-CM | POA: Diagnosis present

## 2016-02-20 DIAGNOSIS — E119 Type 2 diabetes mellitus without complications: Secondary | ICD-10-CM | POA: Diagnosis not present

## 2016-02-20 DIAGNOSIS — Z5309 Procedure and treatment not carried out because of other contraindication: Secondary | ICD-10-CM | POA: Diagnosis not present

## 2016-02-20 HISTORY — PX: RADIOLOGY WITH ANESTHESIA: SHX6223

## 2016-02-20 LAB — COMPREHENSIVE METABOLIC PANEL
ALK PHOS: 49 U/L (ref 38–126)
ALT: 14 U/L (ref 14–54)
AST: 19 U/L (ref 15–41)
Albumin: 3.3 g/dL — ABNORMAL LOW (ref 3.5–5.0)
Anion gap: 10 (ref 5–15)
BUN: 8 mg/dL (ref 6–20)
CALCIUM: 8.7 mg/dL — AB (ref 8.9–10.3)
CHLORIDE: 108 mmol/L (ref 101–111)
CO2: 22 mmol/L (ref 22–32)
CREATININE: 0.6 mg/dL (ref 0.44–1.00)
Glucose, Bld: 143 mg/dL — ABNORMAL HIGH (ref 65–99)
Potassium: 3.6 mmol/L (ref 3.5–5.1)
Sodium: 140 mmol/L (ref 135–145)
Total Bilirubin: 0.6 mg/dL (ref 0.3–1.2)
Total Protein: 6 g/dL — ABNORMAL LOW (ref 6.5–8.1)

## 2016-02-20 LAB — PLATELET INHIBITION P2Y12: PLATELET FUNCTION P2Y12: 279 [PRU] (ref 194–418)

## 2016-02-20 LAB — CBC WITH DIFFERENTIAL/PLATELET
Basophils Absolute: 0 10*3/uL (ref 0.0–0.1)
Basophils Relative: 0 %
EOS PCT: 3 %
Eosinophils Absolute: 0.1 10*3/uL (ref 0.0–0.7)
HCT: 30.5 % — ABNORMAL LOW (ref 36.0–46.0)
Hemoglobin: 9.2 g/dL — ABNORMAL LOW (ref 12.0–15.0)
LYMPHS ABS: 1.4 10*3/uL (ref 0.7–4.0)
LYMPHS PCT: 27 %
MCH: 23.1 pg — AB (ref 26.0–34.0)
MCHC: 30.2 g/dL (ref 30.0–36.0)
MCV: 76.6 fL — AB (ref 78.0–100.0)
MONOS PCT: 8 %
Monocytes Absolute: 0.4 10*3/uL (ref 0.1–1.0)
Neutro Abs: 3.2 10*3/uL (ref 1.7–7.7)
Neutrophils Relative %: 62 %
PLATELETS: 252 10*3/uL (ref 150–400)
RBC: 3.98 MIL/uL (ref 3.87–5.11)
RDW: 15.9 % — ABNORMAL HIGH (ref 11.5–15.5)
WBC: 5.1 10*3/uL (ref 4.0–10.5)

## 2016-02-20 LAB — GLUCOSE, CAPILLARY: Glucose-Capillary: 121 mg/dL — ABNORMAL HIGH (ref 65–99)

## 2016-02-20 LAB — HCG, SERUM, QUALITATIVE: Preg, Serum: NEGATIVE

## 2016-02-20 LAB — APTT: aPTT: 25 seconds (ref 24–37)

## 2016-02-20 LAB — PROTIME-INR
INR: 1.13 (ref 0.00–1.49)
Prothrombin Time: 14.7 seconds (ref 11.6–15.2)

## 2016-02-20 SURGERY — RADIOLOGY WITH ANESTHESIA
Anesthesia: General

## 2016-02-20 MED ORDER — ASPIRIN EC 325 MG PO TBEC
325.0000 mg | DELAYED_RELEASE_TABLET | ORAL | Status: DC
  Filled 2016-02-20: qty 1

## 2016-02-20 MED ORDER — LACTATED RINGERS IV SOLN
INTRAVENOUS | Status: DC
Start: 2016-02-20 — End: 2016-02-20
  Administered 2016-02-20: 08:00:00 via INTRAVENOUS

## 2016-02-20 MED ORDER — SODIUM CHLORIDE 0.9 % IV SOLN
Freq: Once | INTRAVENOUS | Status: DC
Start: 2016-02-20 — End: 2016-02-20

## 2016-02-20 MED ORDER — NIMODIPINE 30 MG PO CAPS
0.0000 mg | ORAL_CAPSULE | ORAL | Status: DC
  Filled 2016-02-20: qty 2

## 2016-02-20 MED ORDER — VANCOMYCIN HCL 1000 MG IV SOLR
1000.0000 mg | INTRAVENOUS | Status: DC
  Filled 2016-02-20: qty 1000

## 2016-02-20 MED ORDER — CLOPIDOGREL BISULFATE 75 MG PO TABS
75.0000 mg | ORAL_TABLET | ORAL | Status: DC
  Filled 2016-02-20: qty 1

## 2016-02-20 NOTE — Anesthesia Preprocedure Evaluation (Addendum)
Anesthesia Evaluation  Patient identified by MRN, date of birth, ID band Patient awake    Reviewed: Allergy & Precautions, NPO status , Patient's Chart, lab work & pertinent test results  Airway Mallampati: II  TM Distance: >3 FB Neck ROM: Full    Dental no notable dental hx. (+) Edentulous Upper, Edentulous Lower, Upper Dentures, Lower Dentures   Pulmonary neg pulmonary ROS,    Pulmonary exam normal breath sounds clear to auscultation       Cardiovascular negative cardio ROS Normal cardiovascular exam Rhythm:Regular Rate:Normal     Neuro/Psych aneurysm CVA negative psych ROS   GI/Hepatic negative GI ROS, Neg liver ROS,   Endo/Other  diabetes, Type 2, Oral Hypoglycemic Agents  Renal/GU negative Renal ROS  negative genitourinary   Musculoskeletal negative musculoskeletal ROS (+)   Abdominal   Peds negative pediatric ROS (+)  Hematology negative hematology ROS (+)   Anesthesia Other Findings   Reproductive/Obstetrics negative OB ROS                         Anesthesia Physical Anesthesia Plan  ASA: III  Anesthesia Plan: General   Post-op Pain Management:    Induction: Intravenous  Airway Management Planned: Oral ETT  Additional Equipment:   Intra-op Plan:   Post-operative Plan: Extubation in OR  Informed Consent: I have reviewed the patients History and Physical, chart, labs and discussed the procedure including the risks, benefits and alternatives for the proposed anesthesia with the patient or authorized representative who has indicated his/her understanding and acceptance.   Dental advisory given  Plan Discussed with: CRNA  Anesthesia Plan Comments: (Case delayed for elevated P2Y12.)       Anesthesia Quick Evaluation

## 2016-02-20 NOTE — Progress Notes (Signed)
Pt informed of P2Y12 elevation and understands the need to cancel surgery.  Prescription for Brilinta received.  Pt instructed that Victorino DikeJennifer from Dr Fatima Sangereveshwar's office will contact her to reschedule.  IV D/C'd and pt discharged herself ambulatory.

## 2016-02-20 NOTE — Progress Notes (Addendum)
Patient ID: Cassandra JeansCarol J Borger, female   DOB: Dec 26, 1965, 50 y.o.   MRN: [id number]   Pt scheduled for ACOM aneurysm embolization today in IR with Dr Corliss Skainseveshwar  P2y12 279 this am Pt confirms taking Plavix 75 mg 2 po daily and ASA 325 mg daily  P2y12 259 02/06/16             171 02/10/16             279 today   Dr Corliss Skainseveshwar aware of result To discuss with pt asap   Addendum: Brilinta 90 mg #60 1 po BID 1 refill  Rx given to pt Scheduler to call pt with new time and date of procedure Good understanding of plan

## 2016-02-21 ENCOUNTER — Encounter (HOSPITAL_COMMUNITY): Payer: Self-pay | Admitting: Interventional Radiology

## 2016-02-24 ENCOUNTER — Other Ambulatory Visit: Payer: Self-pay | Admitting: Radiology

## 2016-02-24 LAB — PLATELET INHIBITION P2Y12: PLATELET FUNCTION P2Y12: 41 [PRU] — AB (ref 194–418)

## 2016-03-01 ENCOUNTER — Telehealth (HOSPITAL_COMMUNITY): Payer: Self-pay | Admitting: *Deleted

## 2016-03-01 NOTE — Telephone Encounter (Signed)
Called and spoke with patient.  Per Dr. Corliss Skainseveshwar patient is to continue current dose of Brilinta 90mg  BID and ASA 325 daily.  Pt restated instructions to me.

## 2016-03-09 ENCOUNTER — Other Ambulatory Visit: Payer: Self-pay | Admitting: Radiology

## 2016-03-09 NOTE — Pre-Procedure Instructions (Signed)
Cassandra JeansCarol J Stalker  03/09/2016      [facility] [address] [place], [place] - [id number] [place] [id number] Phone: [phone] Fax: [phone]    Your procedure is scheduled on Wednesday, April 19th   [facility] at 7:00 AM             (Procedural time is 9:00 - 12:00 )  Call this number if you have problems the morning of surgery:  [phone]   Remember:  Do not eat food or drink liquids after midnight Tuesday.  Take these medicines the morning of surgery with A SIP OF WATER : Plavix & Aspirin as directed by Dr. Corliss Skainseveshwar             DO NOT take any diabetic pills the morning of surgery.   Do not wear jewelry, make-up or nail polish.  Do not wear lotions, powders, or perfumes.  You may NOT wear deodorant the day of the procedure.   Do not shave underarms & legs 48 hours prior to surgery.    Do not bring valuables to the hospital.  [facility] is not responsible for any belongings or valuables.  Contacts, dentures or bridgework may not be worn into surgery.  Leave your suitcase in the car.  After surgery it may be brought to your room. For patients admitted to the hospital, discharge time will be determined by your treatment team.  Name and phone number of your driver:     Please read over the following fact sheets that you were given. Pain Booklet and Surgical Site Infection Prevention

## 2016-03-12 ENCOUNTER — Encounter (HOSPITAL_COMMUNITY): Payer: Self-pay

## 2016-03-12 ENCOUNTER — Encounter (HOSPITAL_COMMUNITY)
Admission: RE | Admit: 2016-03-12 | Discharge: 2016-03-12 | Disposition: A | Discharge status: Home / Self Care | Payer: BLUE CROSS/BLUE SHIELD | Source: Ambulatory Visit | Attending: Interventional Radiology | Admitting: Interventional Radiology

## 2016-03-12 LAB — COMPREHENSIVE METABOLIC PANEL
ALBUMIN: 3.8 g/dL (ref 3.5–5.0)
ALT: 16 U/L (ref 14–54)
ANION GAP: 11 (ref 5–15)
AST: 19 U/L (ref 15–41)
Alkaline Phosphatase: 55 U/L (ref 38–126)
BUN: 8 mg/dL (ref 6–20)
CO2: 22 mmol/L (ref 22–32)
Calcium: 9.2 mg/dL (ref 8.9–10.3)
Chloride: 107 mmol/L (ref 101–111)
Creatinine, Ser: 0.54 mg/dL (ref 0.44–1.00)
GFR calc Af Amer: >60 mL/min (ref >60)
GFR calc non Af Amer: >60 mL/min (ref >60)
GLUCOSE: 139 mg/dL — AB (ref 65–99)
POTASSIUM: 3.8 mmol/L (ref 3.5–5.1)
SODIUM: 140 mmol/L (ref 135–145)
TOTAL PROTEIN: 7.1 g/dL (ref 6.5–8.1)
Total Bilirubin: 0.7 mg/dL (ref 0.3–1.2)

## 2016-03-12 LAB — CBC WITH DIFFERENTIAL/PLATELET
BASOS ABS: 0 10*3/uL (ref 0.0–0.1)
Basophils Relative: 0 %
EOS ABS: 0.1 10*3/uL (ref 0.0–0.7)
Eosinophils Relative: 2 %
HCT: 30.8 % — ABNORMAL LOW (ref 36.0–46.0)
Hemoglobin: 9.4 g/dL — ABNORMAL LOW (ref 12.0–15.0)
LYMPHS ABS: 1.5 10*3/uL (ref 0.7–4.0)
Lymphocytes Relative: 25 %
MCH: 22.5 pg — ABNORMAL LOW (ref 26.0–34.0)
MCHC: 30.5 g/dL (ref 30.0–36.0)
MCV: 73.7 fL — ABNORMAL LOW (ref 78.0–100.0)
Monocytes Absolute: 0.4 10*3/uL (ref 0.1–1.0)
Monocytes Relative: 6 %
Neutro Abs: 4 10*3/uL (ref 1.7–7.7)
Neutrophils Relative %: 67 %
Platelets: 324 10*3/uL (ref 150–400)
RBC: 4.18 MIL/uL (ref 3.87–5.11)
RDW: 15.2 % (ref 11.5–15.5)
WBC: 6 10*3/uL (ref 4.0–10.5)

## 2016-03-12 LAB — PROTIME-INR
INR: 1.09 (ref 0.00–1.49)
Prothrombin Time: 14.3 seconds (ref 11.6–15.2)

## 2016-03-12 LAB — PLATELET INHIBITION P2Y12: Platelet Function  P2Y12: 37 [PRU] — ABNORMAL LOW (ref 194–418)

## 2016-03-12 MED ORDER — LACTATED RINGERS IV SOLN
INTRAVENOUS | Status: DC
Start: 2016-03-12 — End: 2016-03-12

## 2016-03-12 MED ORDER — FENTANYL CITRATE (PF) 100 MCG/2ML IJ SOLN: 25.0000 ug | INTRAMUSCULAR | Status: DC | PRN

## 2016-03-12 MED ORDER — MEPERIDINE HCL 25 MG/ML IJ SOLN: 6.2500 mg | INTRAMUSCULAR | Status: DC | PRN

## 2016-03-12 MED ORDER — PROMETHAZINE HCL 25 MG/ML IJ SOLN: 6.2500 mg | INTRAMUSCULAR | Status: DC | PRN

## 2016-03-12 NOTE — Progress Notes (Signed)
Patient states that blood sugars, since being on the [place] have been high in the mornings, 130-150's.  Last hgbA1c was February 01, 2016, Preg test in March was negative.(did not repeat this test today) Spoke with Barnetta ChapelKelly Osborne, PA concerning the lab draw at PAT.  Labs ordered by P. Carleene Mainsurpin, PA drawn and P2y12 will be redrawn day of procedure.

## 2016-03-12 NOTE — Progress Notes (Signed)
Anesthesia Chart Review:  Pt is a 50 year old female scheduled for cerebral arteriogram with possible L anterior communicating artery embolization on 03/14/2016 with Dr. Corliss Skainseveshwar.   PCP is Dr. Leonia ReaderVan Eyk.   PMH includes: Stroke, DM. Never smoker. BMI 26  Medications include: ASA, plavix (to start 02/01/16), glipizide, metformin, sitagliptin  Preoperative labs reviewed.  - H/H 9.4/30.8. Will get T&S DOS. [facility] Fatima [facility]war's office.   - Serum HCG not obtained at PAT. Will obtain DOS.  - Glucose 139. HgbA1c 6.3 on 02/01/16.   EKG 02/20/16: NSR.   If no changes, I anticipate pt can proceed with surgery as scheduled.   Rica Mastngela Alexxa Sabet, FNP-BC [facility]iology Phone: [phone] 03/12/2016 4:16 PM

## 2016-03-13 ENCOUNTER — Telehealth (HOSPITAL_COMMUNITY): Payer: Self-pay | Admitting: Interventional Radiology

## 2016-03-13 ENCOUNTER — Other Ambulatory Visit: Payer: Self-pay | Admitting: Radiology

## 2016-03-13 NOTE — Telephone Encounter (Signed)
Called pt, talked to her about her scheduled procedure. She is going to take Brilinta 90mg  and Aspirin 325mg  1 full tablet each this morning and only 1/2 tablet of the [employer] Company[employer]in[employer]inta tonight. Pt understands and is in agreement with the current plan. JM

## 2016-03-14 ENCOUNTER — Encounter (HOSPITAL_COMMUNITY)
Admission: AD | Disposition: A | Discharge status: Home / Self Care | Payer: Self-pay | Source: Ambulatory Visit | Attending: Interventional Radiology

## 2016-03-14 ENCOUNTER — Ambulatory Visit (HOSPITAL_COMMUNITY): Payer: BLUE CROSS/BLUE SHIELD

## 2016-03-14 ENCOUNTER — Ambulatory Visit (HOSPITAL_COMMUNITY): Payer: BLUE CROSS/BLUE SHIELD | Admitting: Emergency Medicine

## 2016-03-14 ENCOUNTER — Ambulatory Visit (HOSPITAL_COMMUNITY)
Admission: RE | Admit: 2016-03-14 | Discharge: 2016-03-14 | Disposition: A | Discharge status: Home / Self Care | Payer: BLUE CROSS/BLUE SHIELD | Source: Ambulatory Visit | Attending: Interventional Radiology | Admitting: Interventional Radiology

## 2016-03-14 ENCOUNTER — Encounter (HOSPITAL_COMMUNITY): Payer: Self-pay

## 2016-03-14 ENCOUNTER — Encounter (HOSPITAL_COMMUNITY): Payer: Self-pay | Admitting: *Deleted

## 2016-03-14 ENCOUNTER — Inpatient Hospital Stay (HOSPITAL_COMMUNITY)
Admission: AD | Admit: 2016-03-14 | Discharge: 2016-03-15 | DRG: 027 | Disposition: A | Discharge status: Home / Self Care | Payer: BLUE CROSS/BLUE SHIELD | Source: Ambulatory Visit | Attending: Interventional Radiology | Admitting: Interventional Radiology

## 2016-03-14 ENCOUNTER — Ambulatory Visit (HOSPITAL_COMMUNITY): Payer: BLUE CROSS/BLUE SHIELD | Admitting: Critical Care Medicine

## 2016-03-14 DIAGNOSIS — Z880 Allergy status to penicillin: Secondary | ICD-10-CM | POA: Diagnosis not present

## 2016-03-14 DIAGNOSIS — I729 Aneurysm of unspecified site: Secondary | ICD-10-CM

## 2016-03-14 DIAGNOSIS — Z8673 Personal history of transient ischemic attack (TIA), and cerebral infarction without residual deficits: Secondary | ICD-10-CM | POA: Diagnosis not present

## 2016-03-14 DIAGNOSIS — D649 Anemia, unspecified: Secondary | ICD-10-CM | POA: Diagnosis present

## 2016-03-14 DIAGNOSIS — I671 Cerebral aneurysm, nonruptured: Secondary | ICD-10-CM | POA: Diagnosis present

## 2016-03-14 DIAGNOSIS — Z7984 Long term (current) use of oral hypoglycemic drugs: Secondary | ICD-10-CM | POA: Diagnosis not present

## 2016-03-14 DIAGNOSIS — Z79899 Other long term (current) drug therapy: Secondary | ICD-10-CM

## 2016-03-14 DIAGNOSIS — Z7982 Long term (current) use of aspirin: Secondary | ICD-10-CM | POA: Diagnosis not present

## 2016-03-14 DIAGNOSIS — Z881 Allergy status to other antibiotic agents status: Secondary | ICD-10-CM

## 2016-03-14 DIAGNOSIS — Z91030 Bee allergy status: Secondary | ICD-10-CM | POA: Diagnosis not present

## 2016-03-14 HISTORY — PX: RADIOLOGY WITH ANESTHESIA: SHX6223

## 2016-03-14 LAB — TYPE AND SCREEN
ABO/RH(D): A NEG
Antibody Screen: NEGATIVE

## 2016-03-14 LAB — POCT ACTIVATED CLOTTING TIME
ACTIVATED CLOTTING TIME: 126 s
ACTIVATED CLOTTING TIME: 137 s
Activated Clotting Time: 167 seconds
Activated Clotting Time: 157 seconds

## 2016-03-14 LAB — GLUCOSE, CAPILLARY
GLUCOSE-CAPILLARY: 229 mg/dL — AB (ref 65–99)
Glucose-Capillary: 137 mg/dL — ABNORMAL HIGH (ref 65–99)
Glucose-Capillary: 123 mg/dL — ABNORMAL HIGH (ref 65–99)
Glucose-Capillary: 107 mg/dL — ABNORMAL HIGH (ref 65–99)

## 2016-03-14 LAB — MRSA PCR SCREENING: MRSA BY PCR: NEGATIVE

## 2016-03-14 LAB — PLATELET INHIBITION P2Y12: PLATELET FUNCTION P2Y12: 30 [PRU] — AB (ref 194–418)

## 2016-03-14 LAB — HEPARIN LEVEL (UNFRACTIONATED): Heparin Unfractionated: 0.18 IU/mL — ABNORMAL LOW (ref 0.30–0.70)

## 2016-03-14 LAB — ABO/RH: ABO/RH(D): A NEG

## 2016-03-14 LAB — HCG, SERUM, QUALITATIVE: PREG SERUM: NEGATIVE

## 2016-03-14 SURGERY — RADIOLOGY WITH ANESTHESIA
Anesthesia: Monitor Anesthesia Care

## 2016-03-14 MED ORDER — TICAGRELOR 90 MG PO TABS
45.0000 mg | ORAL_TABLET | Freq: Once | ORAL | Status: AC
  Administered 2016-03-14: 45 mg via ORAL
  Filled 2016-03-14 (×2): qty 1

## 2016-03-14 MED ORDER — MIDAZOLAM HCL 5 MG/5ML IJ SOLN
INTRAMUSCULAR | Status: DC | PRN
  Administered 2016-03-14: 2 mg via INTRAVENOUS

## 2016-03-14 MED ORDER — LIDOCAINE HCL (CARDIAC) 20 MG/ML IV SOLN
INTRAVENOUS | Status: DC | PRN
  Administered 2016-03-14: 30 mg via INTRAVENOUS

## 2016-03-14 MED ORDER — VANCOMYCIN HCL IN DEXTROSE 1-5 GM/200ML-% IV SOLN
INTRAVENOUS | Status: AC
  Administered 2016-03-14: 1000 mg via INTRAVENOUS
  Filled 2016-03-14: qty 200

## 2016-03-14 MED ORDER — HEPARIN (PORCINE) IN NACL 100-0.45 UNIT/ML-% IJ SOLN
INTRAMUSCULAR | Status: AC
  Administered 2016-03-14: 500 [IU]/h via INTRAVENOUS
  Filled 2016-03-14: qty 250

## 2016-03-14 MED ORDER — BLACK COHOSH 540 MG PO CAPS: 1.0000 | ORAL_CAPSULE | Freq: Every day | ORAL | Status: DC

## 2016-03-14 MED ORDER — CLOPIDOGREL BISULFATE 75 MG PO TABS
75.0000 mg | ORAL_TABLET | ORAL | Status: DC
  Filled 2016-03-14: qty 1

## 2016-03-14 MED ORDER — ONDANSETRON HCL 4 MG/2ML IJ SOLN
INTRAMUSCULAR | Status: DC | PRN
  Administered 2016-03-14: 4 mg via INTRAVENOUS

## 2016-03-14 MED ORDER — ACETAMINOPHEN 650 MG RE SUPP: 650.0000 mg | Freq: Four times a day (QID) | RECTAL | Status: DC | PRN

## 2016-03-14 MED ORDER — NICARDIPINE HCL IN NACL 20-0.86 MG/200ML-% IV SOLN: 5.0000 mg/h | INTRAVENOUS | Status: DC

## 2016-03-14 MED ORDER — ARTIFICIAL TEARS OP OINT
TOPICAL_OINTMENT | OPHTHALMIC | Status: DC | PRN
  Administered 2016-03-14: 1 via OPHTHALMIC

## 2016-03-14 MED ORDER — VECURONIUM BROMIDE 10 MG IV SOLR
INTRAVENOUS | Status: DC | PRN
  Administered 2016-03-14: 2 mg via INTRAVENOUS

## 2016-03-14 MED ORDER — GARCINIA CAMBOGIA-CHROMIUM 500-200 MG-MCG PO TABS: 1.0000 | ORAL_TABLET | Freq: Two times a day (BID) | ORAL | Status: DC

## 2016-03-14 MED ORDER — B COMPLEX-C PO TABS
1.0000 | ORAL_TABLET | Freq: Every day | ORAL | Status: DC
  Administered 2016-03-14 – 2016-03-15 (×2): 1 via ORAL
  Filled 2016-03-14 (×2): qty 1

## 2016-03-14 MED ORDER — ONDANSETRON HCL 4 MG/2ML IJ SOLN: 4.0000 mg | Freq: Four times a day (QID) | INTRAMUSCULAR | Status: DC | PRN

## 2016-03-14 MED ORDER — ROCURONIUM BROMIDE 100 MG/10ML IV SOLN
INTRAVENOUS | Status: DC | PRN
  Administered 2016-03-14: 40 mg via INTRAVENOUS
  Administered 2016-03-14: 10 mg via INTRAVENOUS

## 2016-03-14 MED ORDER — GLIPIZIDE ER 5 MG PO TB24: 5.0000 mg | ORAL_TABLET | Freq: Every evening | ORAL | Status: DC | PRN

## 2016-03-14 MED ORDER — VANCOMYCIN HCL 1000 MG IV SOLR
1000.0000 mg | INTRAVENOUS | Status: DC
  Filled 2016-03-14: qty 1000

## 2016-03-14 MED ORDER — HEPARIN (PORCINE) IN NACL 100-0.45 UNIT/ML-% IJ SOLN
700.0000 [IU]/h | INTRAMUSCULAR | Status: AC
  Administered 2016-03-14: 500 [IU]/h via INTRAVENOUS
  Filled 2016-03-14: qty 250

## 2016-03-14 MED ORDER — ONDANSETRON HCL 4 MG/2ML IJ SOLN: 4.0000 mg | Freq: Once | INTRAMUSCULAR | Status: DC | PRN

## 2016-03-14 MED ORDER — ACETAMINOPHEN 500 MG PO TABS
1000.0000 mg | ORAL_TABLET | Freq: Four times a day (QID) | ORAL | Status: DC | PRN
  Administered 2016-03-14 – 2016-03-15 (×2): 1000 mg via ORAL
  Filled 2016-03-14 (×2): qty 2

## 2016-03-14 MED ORDER — OXYCODONE HCL 5 MG PO TABS: 5.0000 mg | ORAL_TABLET | Freq: Once | ORAL | Status: DC | PRN

## 2016-03-14 MED ORDER — TICAGRELOR 90 MG PO TABS
90.0000 mg | ORAL_TABLET | Freq: Once | ORAL | Status: AC
  Administered 2016-03-15: 90 mg via ORAL
  Filled 2016-03-14: qty 1

## 2016-03-14 MED ORDER — TICAGRELOR 90 MG PO TABS: 90.0000 mg | ORAL_TABLET | Freq: Two times a day (BID) | ORAL | Status: DC

## 2016-03-14 MED ORDER — ASPIRIN 325 MG PO TABS: 325.0000 mg | ORAL_TABLET | Freq: Every day | ORAL | Status: DC

## 2016-03-14 MED ORDER — PROPOFOL 10 MG/ML IV BOLUS
INTRAVENOUS | Status: DC | PRN
  Administered 2016-03-14: 160 mg via INTRAVENOUS

## 2016-03-14 MED ORDER — NEOSTIGMINE METHYLSULFATE 10 MG/10ML IV SOLN
INTRAVENOUS | Status: DC | PRN
  Administered 2016-03-14: 3 mg via INTRAVENOUS

## 2016-03-14 MED ORDER — PHENYLEPHRINE HCL 10 MG/ML IJ SOLN
10.0000 mg | INTRAVENOUS | Status: DC | PRN
  Administered 2016-03-14: 10 ug/min via INTRAVENOUS

## 2016-03-14 MED ORDER — ASPIRIN EC 325 MG PO TBEC
325.0000 mg | DELAYED_RELEASE_TABLET | ORAL | Status: DC
  Filled 2016-03-14: qty 1

## 2016-03-14 MED ORDER — SODIUM CHLORIDE 0.9 % IV SOLN
INTRAVENOUS | Status: DC
  Administered 2016-03-14 – 2016-03-15 (×2): via INTRAVENOUS

## 2016-03-14 MED ORDER — ASPIRIN EC 325 MG PO TBEC: 325.0000 mg | DELAYED_RELEASE_TABLET | Freq: Every day | ORAL | Status: DC

## 2016-03-14 MED ORDER — NIMODIPINE 30 MG PO CAPS
0.0000 mg | ORAL_CAPSULE | ORAL | Status: DC
  Filled 2016-03-14: qty 2

## 2016-03-14 MED ORDER — LINAGLIPTIN 5 MG PO TABS
5.0000 mg | ORAL_TABLET | Freq: Every day | ORAL | Status: DC
  Administered 2016-03-15: 5 mg via ORAL
  Filled 2016-03-14: qty 1

## 2016-03-14 MED ORDER — GLYCOPYRROLATE 0.2 MG/ML IJ SOLN
INTRAMUSCULAR | Status: DC | PRN
  Administered 2016-03-14: 0.2 mg via INTRAVENOUS

## 2016-03-14 MED ORDER — OXYCODONE HCL 5 MG/5ML PO SOLN: 5.0000 mg | Freq: Once | ORAL | Status: DC | PRN

## 2016-03-14 MED ORDER — VITAMIN D 1000 UNITS PO TABS
1000.0000 [IU] | ORAL_TABLET | Freq: Every day | ORAL | Status: DC
  Administered 2016-03-14 – 2016-03-15 (×2): 1000 [IU] via ORAL
  Filled 2016-03-14 (×2): qty 1

## 2016-03-14 MED ORDER — NITROGLYCERIN 1 MG/10 ML FOR IR/CATH LAB
INTRA_ARTERIAL | Status: AC
  Filled 2016-03-14: qty 10

## 2016-03-14 MED ORDER — HEPARIN SODIUM (PORCINE) 1000 UNIT/ML IJ SOLN
INTRAMUSCULAR | Status: DC | PRN
  Administered 2016-03-14: 1000 [IU] via INTRAVENOUS
  Administered 2016-03-14: 3000 [IU] via INTRAVENOUS
  Administered 2016-03-14 (×2): 500 [IU] via INTRAVENOUS

## 2016-03-14 MED ORDER — FENTANYL CITRATE (PF) 100 MCG/2ML IJ SOLN
INTRAMUSCULAR | Status: DC | PRN
  Administered 2016-03-14: 50 ug via INTRAVENOUS
  Administered 2016-03-14: 100 ug via INTRAVENOUS
  Administered 2016-03-14: 50 ug via INTRAVENOUS

## 2016-03-14 MED ORDER — ASPIRIN 81 MG PO CHEW
81.0000 mg | CHEWABLE_TABLET | Freq: Every day | ORAL | Status: DC
  Administered 2016-03-15: 81 mg via ORAL
  Filled 2016-03-14: qty 1

## 2016-03-14 MED ORDER — INSULIN ASPART 100 UNIT/ML ~~LOC~~ SOLN
0.0000 [IU] | Freq: Three times a day (TID) | SUBCUTANEOUS | Status: DC
  Administered 2016-03-15: 1 [IU] via SUBCUTANEOUS

## 2016-03-14 MED ORDER — SODIUM CHLORIDE 0.9 % IV SOLN
INTRAVENOUS | Status: DC
  Administered 2016-03-14 (×2): via INTRAVENOUS

## 2016-03-14 MED ORDER — LIDOCAINE HCL 1 % IJ SOLN
INTRAMUSCULAR | Status: AC
  Filled 2016-03-14: qty 20

## 2016-03-14 MED ORDER — IOPAMIDOL (ISOVUE-300) INJECTION 61%
INTRAVENOUS | Status: AC
  Administered 2016-03-14: 90 mL
  Filled 2016-03-14: qty 300

## 2016-03-14 MED ORDER — FENTANYL CITRATE (PF) 100 MCG/2ML IJ SOLN: 25.0000 ug | INTRAMUSCULAR | Status: DC | PRN

## 2016-03-14 NOTE — Procedures (Signed)
S/P Lt CCA followed by endovascular treatment of ACOM aneurysm using the pipeline flow diverter device

## 2016-03-14 NOTE — Transfer of Care (Signed)
Immediate Anesthesia Transfer of Care Note  Patient: Cassandra Schmidt  Procedure(s) Performed: Procedure(s): EMBOLIZATION (N/A)  Patient Location: PACU  Anesthesia Type:General  Level of Consciousness: awake, alert  and oriented  Airway & Oxygen Therapy: Patient Spontanous Breathing and Patient connected to nasal cannula oxygen  Post-op Assessment: Report given to RN, Post -op Vital signs reviewed and stable and Patient moving all extremities  Post vital signs: Reviewed and stable  Last Vitals:  Filed Vitals:   03/14/16 1300  Temp: 36.6 C    Complications: No apparent anesthesia complications

## 2016-03-14 NOTE — Progress Notes (Addendum)
Nimotop held due to bp 113/72.  Beckey DowningPam Turpin, PA in to see pt -  Discussed order for Plavix noted, but pt on Brylinta- Plavix held

## 2016-03-14 NOTE — Progress Notes (Signed)
ANTICOAGULATION CONSULT NOTE  Pharmacy Consult for Heparin Indication: Dr. Corliss Skainseveshwar  Allergies  Allergen Reactions  . Keflex [Cephalexin] Anaphylaxis    Throat swelling  . Bee Venom Swelling  . Penicillins Itching, Rash and Other (See Comments)    Patient with history of ANAPHYLAXIS with ORAL CEPHALOSPORINS, Has patient had a PCN reaction causing immediate rash, facial/tongue/throat swelling, SOB or lightheadedness with hypotension: Yes Has patient had a PCN reaction causing severe rash involving mucus membranes or skin necrosis: No Has patient had a PCN reaction that required hospitalization No Has patient had a PCN reaction occurring within the last 10 years: No If all of the above answers are "NO", then may proceed with Cephalosporin use.    Patient Measurements: Height: 5\' 3"  (160 cm) Weight: 157 lb 13.6 oz (71.6 kg) IBW/kg (Calculated) : 52.4 Heparin Dosing Weight: 67.15 kg  Vital Signs: Temp: 98 F (36.7 C) (04/19 1600) Temp Source: Oral (04/19 1600) BP: 93/60 mmHg (04/19 2100) Pulse Rate: 88 (04/19 2100)  Labs:  Recent Labs  03/12/16 1001 03/14/16 2057  HGB 9.4*  --   HCT 30.8*  --   PLT 324  --   LABPROT 14.3  --   INR 1.09  --   HEPARINUNFRC  --  0.18*  CREATININE 0.54  --     Estimated Creatinine Clearance: 82.5 mL/min (by C-G formula based on Cr of 0.54).   Medical History: Past Medical History  Diagnosis Date  . Stroke (HCC)   . Depression   . Anxiety   . Headache   . Arthritis   . Diabetes mellitus without complication (HCC)     Type II    Assessment: 50 y/o F presents for cerebral arteriogram with possible anterior communicating artery aneurysm embolization. S/p CVA 12/16 with left sided weakness and numbness. Abnormal MRI/MRA did reveal intracranial aneurysm and dural AV fustula.  --4/19: S/P Lt CCA followed by endovascular treatment of ACOM aneurysm using the pipeline flow diverter device  Dr. Corliss Skainseveshwar heparin dosing s/p cerebral  arteriogram. HL therapeutic x1 (0.18) on 600 units/h. Hg 9.4, plt wnl. No bleed documented.  Goal of Therapy:  HL 0.1-0.25 Monitor platelets by anticoagulation protocol: Yes   Plan:  Continue IV heparin at 600 units/hr 6h HL to confirm Daily HL/CBC Monitor s/sx bleeding  Babs BertinHaley Jarquis Walker, PharmD, [facility] Clinical Pharmacist Pager [phone] 03/14/2016 9:36 PM

## 2016-03-14 NOTE — Anesthesia Preprocedure Evaluation (Addendum)
Anesthesia Evaluation  Patient identified by MRN, date of birth, ID band Patient awake    Reviewed: Allergy & Precautions, NPO status , Patient's Chart, lab work & pertinent test results  Airway Mallampati: II  TM Distance: >3 FB Neck ROM: Full    Dental  (+) Teeth Intact, Dental Advisory Given   Pulmonary    breath sounds clear to auscultation       Cardiovascular  Rhythm:Regular     Neuro/Psych    GI/Hepatic   Endo/Other  diabetes  Renal/GU      Musculoskeletal   Abdominal   Peds  Hematology   Anesthesia Other Findings   Reproductive/Obstetrics                            Anesthesia Physical Anesthesia Plan  ASA: III  Anesthesia Plan: MAC and General   Post-op Pain Management:    Induction: Intravenous  Airway Management Planned:   Additional Equipment:   Intra-op Plan:   Post-operative Plan:   Informed Consent: I have reviewed the patients History and Physical, chart, labs and discussed the procedure including the risks, benefits and alternatives for the proposed anesthesia with the patient or authorized representative who has indicated his/her understanding and acceptance.   Dental advisory given  Plan Discussed with: CRNA and Anesthesiologist  Anesthesia Plan Comments:         Anesthesia Quick Evaluation

## 2016-03-14 NOTE — Progress Notes (Signed)
Referring Physician(s): Dr Eliezer Champagne  Supervising Physician: Julieanne Cotton  Chief Complaint:  L Anterior communicating artery aneurysm  S/P Lt CCA followed by endovascular treatment of ACOM aneurysm using the pipeline flow diverter device  Subjective:  Resting Now in 79M Sore throat Speaking with clarity and appropriately Complains Art line is painful Also wants foley out Moving all 4s No other complaints  Allergies: Keflex; Bee venom; and Penicillins  Medications: Prior to Admission medications   Medication Sig Start Date End Date Taking? Authorizing Provider  aspirin 325 MG EC tablet Take 325 mg by mouth daily.   Yes Historical Provider, MD  b complex vitamins tablet Take 1 tablet by mouth daily.   Yes Historical Provider, MD  Black Cohosh 540 MG CAPS Take 1 capsule by mouth daily.   Yes Historical Provider, MD  Cholecalciferol (VITAMIN D-3) 1000 units CAPS Take 1 capsule by mouth daily.   Yes Historical Provider, MD  Garcinia Cambogia-Chromium 500-200 MG-MCG TABS Take 1 capsule by mouth 2 (two) times daily. Reported on 03/09/2016   Yes Historical Provider, MD  glipiZIDE (GLUCOTROL XL) 5 MG 24 hr tablet Take 5 mg by mouth at bedtime as needed (if blood sugar >150). Reported on 03/09/2016   Yes Historical Provider, MD  metFORMIN (GLUMETZA) 500 MG (MOD) 24 hr tablet Take 1,000 mg by mouth daily with supper.   Yes Historical Provider, MD  sitaGLIPtin (JANUVIA) 100 MG tablet Take 100 mg by mouth every morning.    Yes Historical Provider, MD  ticagrelor (BRILINTA) 90 MG TABS tablet Take 90 mg by mouth 2 (two) times daily.   Yes Historical Provider, MD     Vital Signs: BP 93/56 mmHg  Pulse 82  Temp(Src) 98.2 F (36.8 C)  Resp 5  Ht  (1.6 m)  Wt 156 lb (70.761 kg)  BMI 27.64 kg/m2  SpO2 100%  LMP 02/17/2016  Physical Exam  Constitutional: She is oriented to person, place, and time.  Face symmetrical Smile = pleasant  Eyes: EOM are normal.    Pulmonary/Chest: Effort normal and breath sounds normal.  Abdominal: Soft.  Musculoskeletal: Normal range of motion.  Left arm/hand seems sl weaker---(but pt states art line painful to her and cant use this arm because of this)  Neurological: She is alert and oriented to person, place, and time.  Skin: Skin is warm and dry.  Rt groin NT; no bleeding; NT No hematoma Rt foot 2+ pulses  Psychiatric: She has a normal mood and affect. Her behavior is normal. Thought content normal.  Nursing note and vitals reviewed.   Imaging: Ct Head Wo Contrast  03/14/2016  CLINICAL DATA:  Stuttering for 2 days.  History of aneurysm EXAM: CT HEAD WITHOUT CONTRAST TECHNIQUE: Contiguous axial images were obtained from the base of the skull through the vertex without intravenous contrast. COMPARISON:  11/24/2015 FINDINGS: Skull and Sinuses:Negative for fracture or destructive process. Chronic adenoid thickening.  Clear visualized sinuses and mastoids. Visualized orbits: Negative. Brain: No evidence of acute infarction, hemorrhage, hydrocephalus, or mass lesion/mass effect. Low-density posterior and inferior to the right putamen is a dilated perivascular space based on 2016 brain MRI. Left frontal lobe parasagittal gliosis on prior brain MRI is subtle on this scan. IMPRESSION: No acute finding or change from 2016. Electronically Signed   By: Marnee Spring M.D.   On: 03/14/2016 09:21    Labs:  CBC:  Recent Labs  12/30/15 0818 02/01/16 1452 02/20/16 0706 03/12/16 1001  WBC 6.3 5.5  5.1 6.0  HGB 11.1* 10.3* 9.2* 9.4*  HCT 35.9* 32.9* 30.5* 30.8*  PLT 266 303 252 324    COAGS:  Recent Labs  12/30/15 0818 02/01/16 1452 02/20/16 0706 03/12/16 1001  INR 1.03 1.01 1.13 1.09  APTT 26 26 25   --     BMP:  Recent Labs  12/30/15 0818 02/01/16 1452 02/20/16 0706 03/12/16 1001  NA 139 138 140 140  K 3.7 4.1 3.6 3.8  CL 105 103 108 107  CO2 24 24 22 22   GLUCOSE 119* 137* 143* 139*  BUN 7 7 8 8    CALCIUM 9.2 9.4 8.7* 9.2  CREATININE 0.58 0.60 0.60 0.54  GFRNONAA >60 >60 >60 >60  GFRAA >60 >60 >60 >60    LIVER FUNCTION TESTS:  Recent Labs  02/01/16 1452 02/20/16 0706 03/12/16 1001  BILITOT 0.3 0.6 0.7  AST 23 19 19   ALT 16 14 16   ALKPHOS 54 49 55  PROT 7.0 6.0* 7.1  ALBUMIN 3.7 3.3* 3.8    Assessment and Plan:  Post L ACOM aneurysm flow diverter stent placement Groggy but alert Will report to Dr Corliss Skainseveshwar  Electronically Signed: Ralene MuskratURPIN,Wlliam Grosso A 03/14/2016, 2:29 PM   I spent a total of 15 Minutes at the the patient's bedside AND on the patient's hospital floor or unit, greater than 50% of which was counseling/coordinating care for acom aneurysm flow diverter stent placement

## 2016-03-14 NOTE — Sedation Documentation (Signed)
MD at bedside. 

## 2016-03-14 NOTE — Anesthesia Procedure Notes (Signed)
Procedure Name: Intubation Date/Time: 03/14/2016 9:33 AM Performed by: Marena ChancyBECKNER, Nikayla Madaris S Pre-anesthesia Checklist: Emergency Drugs available, Patient identified, Timeout performed, Suction available and Patient being monitored Patient Re-evaluated:Patient Re-evaluated prior to inductionOxygen Delivery Method: Circle system utilized Preoxygenation: Pre-oxygenation with 100% oxygen Intubation Type: IV induction Ventilation: Mask ventilation without difficulty and Oral airway inserted - appropriate to patient size Laryngoscope Size: Hyacinth MeekerMiller and 2 Grade View: Grade I Tube type: Oral Tube size: 7.5 mm Number of attempts: 1 Placement Confirmation: ETT inserted through vocal cords under direct vision,  positive ETCO2 and breath sounds checked- equal and bilateral Tube secured with: Tape Dental Injury: Teeth and Oropharynx as per pre-operative assessment

## 2016-03-14 NOTE — Anesthesia Postprocedure Evaluation (Signed)
Anesthesia Post Note  Patient: Cassandra Schmidt  Procedure(s) Performed: Procedure(s) (LRB): EMBOLIZATION (N/A)  Patient location during evaluation: NICU Anesthesia Type: General Level of consciousness: awake, oriented and awake and alert Pain management: pain level controlled Vital Signs Assessment: post-procedure vital signs reviewed and stable Respiratory status: spontaneous breathing, respiratory function stable and nonlabored ventilation Cardiovascular status: blood pressure returned to baseline Anesthetic complications: no    Last Vitals:  Filed Vitals:   03/14/16 1359 03/14/16 1400  BP: 93/56   Pulse: 85 82  Temp:  36.8 C  Resp: 0 5    Last Pain: There were no vitals filed for this visit.               Latavia Goga COKER

## 2016-03-14 NOTE — OR Nursing (Signed)
Ms. Cassandra Schmidt arrived to neuro pacu from IR and attached to monitors, oxygen, and IV pump.  Upon neurological assessement, pt has continued Left sided weakness.  She can extend and flex her left elbow. Due to pain from the Left radial arterial line, she cannot squeeze her fingers closed to assess grip strength in her left hand.  She states it hurts to close her left hand into a fist and points to the arterial line site.  Her fingers in her left hand are warm and cap refill is less than 3 seconds.

## 2016-03-14 NOTE — H&P (Signed)
Chief Complaint: Patient was seen in consultation today for cerebral arteriogram with possible anterior communicating artery aneurysm embolization at the request of Dr Crist Fat  Referring Physician(s): Dr Michel Santee Eyk  Supervising Physician: Julieanne Cotton  History of Present Illness: Cassandra Schmidt is a 50 y.o. 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  Dural arteriovenous malformation being fed from the left occipital artery, and left middle meningeal artery.  Hypoplastic medial 2/3 of the left transverse sinus.  Prominent venous outflow, just medial to the prominent right internal jugular vein.  Scheduled now for cerebral arteriogram with possible embolization of ACOM aneurysm Will address Dural ArterioVenous fistula at later date  Was scheduled for procedure 01/2016 P2y12  279  02/20/16 while on Plavix Changed medication from Plavix to Brlinita p2y12  30 today  Dr Corliss Skains to determine plan  Past Medical History  Diagnosis Date  . Stroke (HCC)   . Depression   . Anxiety   . Headache   . Arthritis   . Diabetes mellitus without complication (HCC)     Type II    Past Surgical History  Procedure Laterality Date  . Cesarean section      3 C-SECTIONS  . Tonsillectomy    . Radiology with anesthesia N/A 02/20/2016    Procedure: RADIOLOGY WITH ANESTHESIA;  Surgeon: Julieanne Cotton, MD;  Location: [facility] OR;  Service: Radiology;  Laterality: N/A;  . Tubal ligation      Allergies: Keflex; Bee venom; and Penicillins  Medications: Prior to Admission medications   Medication Sig Start  Date End Date Taking? Authorizing Provider  aspirin 325 MG EC tablet Take 325 mg by mouth daily.    Historical Provider, MD  b complex vitamins tablet Take 1 tablet by mouth daily.    Historical Provider, MD  Black Cohosh 540 MG CAPS Take 1 capsule by mouth daily.    Historical Provider, MD  Cholecalciferol (VITAMIN D-3) 1000 units CAPS Take 1 capsule by mouth daily.    Historical Provider, MD  Garcinia Cambogia-Chromium 500-200 MG-MCG TABS Take 1 capsule by mouth 2 (two) times daily. Reported on 03/09/2016    Historical Provider, MD  glipiZIDE (GLUCOTROL XL) 5 MG 24 hr tablet Take 5 mg by mouth at bedtime as needed (if blood sugar >150). Reported on 03/09/2016    Historical Provider, MD  metFORMIN (GLUMETZA) 500 MG (MOD) 24 hr tablet Take 1,000 mg by mouth daily with supper.    Historical Provider, MD  sitaGLIPtin (JANUVIA) 100 MG tablet Take 100 mg by mouth every morning.     Historical Provider, MD  ticagrelor (BRILINTA) 90 MG TABS tablet Take 90 mg by mouth 2 (two) times daily.    Historical Provider, MD     History reviewed. No pertinent family history.  Social History   Social History  . Marital Status: Married    Spouse Name: N/A  . Number of Children: N/A  . Years of Education: N/A   Social History Main Topics  . Smoking status: Never Smoker   . Smokeless tobacco: Never Used  . Alcohol Use: No  . Drug Use: No  . Sexual Activity: Not Asked   Other Topics Concern  .  None   Social History Narrative     Review of Systems: A 12 point ROS discussed and pertinent positives are indicated in the HPI above.  All other systems are negative.  Review of Systems  Constitutional: Negative for fever, activity change, appetite change and fatigue.  HENT: Negative for tinnitus and trouble swallowing.   Eyes: Negative for visual disturbance.  Respiratory: Negative for shortness of breath.   Gastrointestinal: Negative for abdominal pain.  Musculoskeletal: Negative for back pain and gait  problem.  Neurological: Positive for headaches. Negative for dizziness, tremors, seizures, syncope, facial asymmetry, speech difficulty, weakness, light-headedness and numbness.  Psychiatric/Behavioral: Negative for behavioral problems and confusion.    Vital Signs: BP 113/72 mmHg  Pulse 95  Temp(Src) 97.8 F (36.6 C) (Oral)  Resp 16  Ht  (1.6 m)  Wt 156 lb (70.761 kg)  BMI 27.64 kg/m2  SpO2 100%  LMP 02/10/2016  Physical Exam  Constitutional: She is oriented to person, place, and time. She appears well-nourished.  HENT:  Head: Atraumatic.  Eyes: EOM are normal.  Neck: Neck supple.  Cardiovascular: Normal rate, regular rhythm and normal heart sounds.   No murmur heard. Pulmonary/Chest: Effort normal and breath sounds normal. She has no wheezes.  Abdominal: Soft. Bowel sounds are normal. There is no tenderness.  Musculoskeletal: Normal range of motion. She exhibits no edema or tenderness.  Neurological: She is alert and oriented to person, place, and time.  Skin: Skin is warm and dry.  Psychiatric: She has a normal mood and affect. Her behavior is normal. Judgment and thought content normal.  Nursing note and vitals reviewed.   Mallampati Score:  MD Evaluation Airway: WNL Heart: WNL Abdomen: WNL Chest/ Lungs: WNL ASA  Classification: 2 Mallampati/Airway Score: One  Imaging: No results found.  Labs:  CBC:  Recent Labs  12/30/15 0818 02/01/16 1452 02/20/16 0706 03/12/16 1001  WBC 6.3 5.5 5.1 6.0  HGB 11.1* 10.3* 9.2* 9.4*  HCT 35.9* 32.9* 30.5* 30.8*  PLT 266 303 252 324    COAGS:  Recent Labs  12/30/15 0818 02/01/16 1452 02/20/16 0706 03/12/16 1001  INR 1.03 1.01 1.13 1.09  APTT --     BMP:  Recent Labs  12/30/15 0818 02/01/16 1452 02/20/16 0706 03/12/16 1001  NA 139 138 140 140  K 3.7 4.1 3.6 3.8  CL 105 103 108 107  CO2 GLUCOSE 119* 137* 143* 139*  BUN CALCIUM 9.2 9.4 8.7* 9.2  CREATININE  0.58 0.60 0.60 0.54  GFRNONAA >60 >60 >60 >60  GFRAA >60 >60 >60 >60    LIVER FUNCTION TESTS:  Recent Labs  02/01/16 1452 02/20/16 0706 03/12/16 1001  BILITOT 0.3 0.6 0.7  AST ALT ALKPHOS 54 49 55  PROT 7.0 6.0* 7.1  ALBUMIN 3.7 3.3* 3.8    TUMOR MARKERS: No results for input(s): AFPTM, CEA, CA199, CHROMGRNA in the last 8760 hours.  Assessment and Plan:  Headaches and CVA 11/2014 Seen in consultation 12/2015 when work up identified left anterior cerebral artery/anterior communicating artery aneurysm. Arteriogram 12/30/2015 revealed aneurysm and dural AV fistula Now scheduled for cerebral arteriogram with possible aneurysm embolization Dural AVF to be addressed at later date Risks and Benefits discussed with the patient including, but not limited to bleeding, infection, vascular injury, contrast induced renal failure, stroke or even death. All of the patient's questions were answered, patient is  agreeable to proceed. Consent signed and in chart.  Pt aware if intervention is complete; she will be admitted overnight to Neuro ICU and plan for discharge in am Agreeable to proceed  Thank you for this interesting consult.  I greatly enjoyed meeting Brunetta JeansCarol J Chakraborty and look forward to participating in their care.  A copy of this report was sent to the requesting provider on this date.  Electronically Signed: Aleaha Fickling A 03/14/2016, 7:56 AM   I spent a total of  30 Minutes   in face to face in clinical consultation, greater than 50% of which was counseling/coordinating care for cerebral arteriogram with possible ACOM artery aneurysm embolization

## 2016-03-14 NOTE — Sedation Documentation (Signed)
Right Groin 6 [place] Exoseal closure attempted by Cablevision SystemsJuliet Cushmon, RT.  Manual pressure being held.

## 2016-03-14 NOTE — Progress Notes (Signed)
PHARMACIST - PHYSICIAN ORDER COMMUNICATION  CONCERNING: P&T Medication Policy on Herbal Medications  DESCRIPTION:  This patient's order for:  Black Cohosh and Garcinia Cambogia  has been noted.  This product(s) is classified as an "herbal" or natural product. Due to a lack of definitive safety studies or FDA approval, nonstandard manufacturing practices, plus the potential risk of unknown drug-drug interactions while on inpatient medications, the Pharmacy and Therapeutics Committee does not permit the use of "herbal" or natural products of this type within [facility].   ACTION TAKEN: The pharmacy department is unable to verify this order at this time and your patient has been informed of this safety policy. Please reevaluate patient's clinical condition at discharge and address if the herbal or natural product(s) should be resumed at that time.  Elige KoLatousha P. Shayan Bramhall, Pharm.D., BCPS Clinical Pharmacist

## 2016-03-14 NOTE — Sedation Documentation (Signed)
Right femoral artery accessed by Dr Deveshwar 

## 2016-03-14 NOTE — Progress Notes (Signed)
ANTICOAGULATION CONSULT NOTE - Initial Consult  Pharmacy Consult for Heparin Indication: Dr. Corliss Skainseveshwar  Allergies  Allergen Reactions  . Keflex [Cephalexin] Anaphylaxis    Throat swelling  . Bee Venom Swelling  . Penicillins Itching, Rash and Other (See Comments)    Patient with history of ANAPHYLAXIS with ORAL CEPHALOSPORINS, Has patient had a PCN reaction causing immediate rash, facial/tongue/throat swelling, SOB or lightheadedness with hypotension: Yes Has patient had a PCN reaction causing severe rash involving mucus membranes or skin necrosis: No Has patient had a PCN reaction that required hospitalization No Has patient had a PCN reaction occurring within the last 10 years: No If all of the above answers are "NO", then may proceed with Cephalosporin use.    Patient Measurements: Height: 5\' 3"  (160 cm) Weight: 156 lb (70.761 kg) IBW/kg (Calculated) : 52.4 Heparin Dosing Weight: 67.15 kg  Vital Signs: Temp: 97.8 F (36.6 C) (04/19 1300) Temp Source: Oral (04/19 0731) BP: 93/53 mmHg (04/19 1329) Pulse Rate: 82 (04/19 1330)  Labs:  Recent Labs  03/12/16 1001  HGB 9.4*  HCT 30.8*  PLT 324  LABPROT 14.3  INR 1.09  CREATININE 0.54    Estimated Creatinine Clearance: 82.1 mL/min (by C-G formula based on Cr of 0.54).   Medical History: Past Medical History  Diagnosis Date  . Stroke (HCC)   . Depression   . Anxiety   . Headache   . Arthritis   . Diabetes mellitus without complication (HCC)     Type II    Assessment: 50 y/o F presents for cerebral arteriogram with possible anterior communicating artery aneurysm embolization. S/p CVA 12/16 with left sided weakness and numbness. Abnormal MRI/MRA did reveal intracranial aneurysm and dural AV fustula.  --4/19: S/P Lt CCA followed by endovascular treatment of ACOM aneurysm using the pipeline flow diverter device  Anticoagulation: Start Dr. Corliss Skainseveshwar heparin dosing s/p cerebral arteriogram  Goal of Therapy:  HL  0.1-0.25 Monitor platelets by anticoagulation protocol: Yes   Plan:  Increase IV heparin to 600 units/hr Check heparin level in 6 hrs. HL and CBC in AM  Haven Foss S. Merilynn Finlandobertson, PharmD, BCPS Clinical Staff Pharmacist Pager [phone]  Misty Stanleyobertson, Iyad Deroo Stillinger 03/14/2016,1:53 PM

## 2016-03-15 ENCOUNTER — Encounter (HOSPITAL_COMMUNITY): Payer: Self-pay | Admitting: Interventional Radiology

## 2016-03-15 LAB — BASIC METABOLIC PANEL
Anion gap: 10 (ref 5–15)
CO2: 20 mmol/L — ABNORMAL LOW (ref 22–32)
CREATININE: 0.59 mg/dL (ref 0.44–1.00)
Calcium: 7.9 mg/dL — ABNORMAL LOW (ref 8.9–10.3)
Chloride: 112 mmol/L — ABNORMAL HIGH (ref 101–111)
GFR calc Af Amer: >60 mL/min (ref >60)
Glucose, Bld: 156 mg/dL — ABNORMAL HIGH (ref 65–99)
POTASSIUM: 3.5 mmol/L (ref 3.5–5.1)
SODIUM: 142 mmol/L (ref 135–145)

## 2016-03-15 LAB — CBC WITH DIFFERENTIAL/PLATELET
BASOS ABS: 0 10*3/uL (ref 0.0–0.1)
Basophils Relative: 0 %
Eosinophils Absolute: 0 10*3/uL (ref 0.0–0.7)
Eosinophils Relative: 1 %
HCT: 23.8 % — ABNORMAL LOW (ref 36.0–46.0)
Hemoglobin: 6.9 g/dL — CL (ref 12.0–15.0)
LYMPHS ABS: 1.2 10*3/uL (ref 0.7–4.0)
Lymphocytes Relative: 27 %
MCH: 21.6 pg — ABNORMAL LOW (ref 26.0–34.0)
MCHC: 29 g/dL — ABNORMAL LOW (ref 30.0–36.0)
MCV: 74.4 fL — ABNORMAL LOW (ref 78.0–100.0)
MONO ABS: 0.4 10*3/uL (ref 0.1–1.0)
Monocytes Relative: 8 %
Neutro Abs: 2.9 10*3/uL (ref 1.7–7.7)
Neutrophils Relative %: 64 %
PLATELETS: 215 10*3/uL (ref 150–400)
RBC: 3.2 MIL/uL — AB (ref 3.87–5.11)
RDW: 15.3 % (ref 11.5–15.5)
WBC: 4.5 10*3/uL (ref 4.0–10.5)

## 2016-03-15 LAB — PLATELET INHIBITION P2Y12: PLATELET FUNCTION P2Y12: 203 [PRU] (ref 194–418)

## 2016-03-15 LAB — CBC
HCT: 23.6 % — ABNORMAL LOW (ref 36.0–46.0)
Hemoglobin: 6.9 g/dL — CL (ref 12.0–15.0)
MCH: 21.8 pg — ABNORMAL LOW (ref 26.0–34.0)
MCHC: 29.2 g/dL — AB (ref 30.0–36.0)
MCV: 74.4 fL — ABNORMAL LOW (ref 78.0–100.0)
Platelets: 228 10*3/uL (ref 150–400)
RBC: 3.17 MIL/uL — ABNORMAL LOW (ref 3.87–5.11)
RDW: 15.3 % (ref 11.5–15.5)
WBC: 4.7 10*3/uL (ref 4.0–10.5)

## 2016-03-15 LAB — GLUCOSE, CAPILLARY
GLUCOSE-CAPILLARY: 138 mg/dL — AB (ref 65–99)
Glucose-Capillary: 133 mg/dL — ABNORMAL HIGH (ref 65–99)

## 2016-03-15 LAB — HEPARIN LEVEL (UNFRACTIONATED)

## 2016-03-15 MED ORDER — ASPIRIN 81 MG PO CHEW: 81.0000 mg | CHEWABLE_TABLET | Freq: Every day | ORAL | Status: DC

## 2016-03-15 NOTE — Discharge Summary (Signed)
Patient ID: Cassandra JeansCarol J Cockerham MRN: [id number] DOB/AGE: 48/21/1967 48 y.o.  Admit date: 03/14/2016 Discharge date: 03/15/2016  Supervising Physician: Julieanne Cottoneveshwar, Sanjeev  Admission Diagnoses:  1. 2.2 x 2 mm wide necked anterior communicating artery region aneurysm   Discharge Diagnoses:  Active Problems:   Brain aneurysm   Discharged Condition: good  Hospital Course: The patient was admitted and intervention was completed for the above diagnosis.  Please see her dictate report for full details.  Her foley was able to be removed post operatively and her diet was able to be advanced.  She tolerated all of this well.  She was feeling well with no issues on POD 1.  She was mobilizing well too.  She was noted to have a pre-op hgb of 9.4 which fell to 6.9 with IVF hydration.  She has no evidence of bleeding as she denies HA, blood in her bowels or urine.  This is felt to be dilutional and that she likely has anemia secondary to her vegan lifestyle.  Fe supplements were encourage upon discharge.  Consults: None  Treatments: S/P Lt CCA followed by endovascular treatment of ACOM aneurysm using the pipeline flow diverter device  Discharge Exam: Blood pressure 93/56, pulse 83, temperature 99.4 F (37.4 C), temperature source Oral, resp. rate 18, height 5\' 3"  (1.6 m), weight 157 lb 13.6 oz (71.6 kg), last menstrual period 02/17/2016, SpO2 95 %. General appearance: alert and cooperative Resp: clear to auscultation bilaterally Cardio: regular rate and rhythm Neurologic: Alert and oriented X 3, normal strength and tone. Cranial nerves II-XII are intact  Disposition: 01-Home or Self Care     Medication List    STOP taking these medications        aspirin 325 MG EC tablet  Replaced by:  aspirin 81 MG chewable tablet      TAKE these medications        aspirin 81 MG chewable tablet  Chew 1 tablet (81 mg total) by mouth daily.     b complex vitamins tablet  Take 1 tablet by mouth daily.      Black Cohosh 540 MG Caps  Take 1 capsule by mouth daily.     Garcinia Cambogia-Chromium 500-200 MG-MCG Tabs  Take 1 capsule by mouth 2 (two) times daily. Reported on 03/09/2016     glipiZIDE 5 MG 24 hr tablet  Commonly known as:  GLUCOTROL XL  Take 5 mg by mouth at bedtime as needed (if blood sugar >150). Reported on 03/09/2016     metFORMIN 500 MG (MOD) 24 hr tablet  Commonly known as:  GLUMETZA  Take 1,000 mg by mouth daily with supper.     sitaGLIPtin 100 MG tablet  Commonly known as:  JANUVIA  Take 100 mg by mouth every morning.     ticagrelor 90 MG Tabs tablet  Commonly known as:  BRILINTA  Take 90 mg by mouth 2 (two) times daily.     Vitamin D-3 1000 units Caps  Take 1 capsule by mouth daily.           Follow-up Information    Follow up with DEVESHWAR, Grandville SilosSANJEEV K, MD In 2 weeks.   Specialty:  Interventional Radiology   Why:  Victorino DikeJennifer will call you   Contact information:   [address] [place] [address] [phone]        Electronically Signed: Barnetta ChapelSBORNE,Lashona Schaaf E 03/15/2016, 10:11 AM   I have spent Less Than 30 Minutes discharging Cassandra Jeansarol J Stratton.

## 2016-03-15 NOTE — Progress Notes (Signed)
Discharge summary given to patient and questions answered. Husband and daughter present at discharge. Stroke education and prevention gone over with patient along with signs and symptoms to be aware of and to call 911 for emergencies. IV was removed from left hand intact and patient belongings were taken home by patient including dentures and glasses. Patient neurologically intact, groin site intact. Patient was dressed and discharged home with daughter via wheelchair and staff escort.

## 2016-03-15 NOTE — Progress Notes (Signed)
ANTICOAGULATION CONSULT NOTE - Follow Up Consult  Pharmacy Consult for heparin Indication: s/p cerebral angiogram   Labs:  Recent Labs  03/12/16 1001 03/14/16 2057 03/15/16 0315  HGB 9.4*  --  PENDING  HCT 30.8*  --  23.8*  PLT 324  --  215  LABPROT 14.3  --   --   INR 1.09  --   --   HEPARINUNFRC  --  0.18* <0.10*  CREATININE 0.54  --   --     Assessment: 49yo female subtherapeutic on heparin after one level at goal.  Goal of Therapy:  Heparin level 0.1-0.25 units/ml   Plan:  Will increase heparin gtt slightly to 700 units/hr until off at 0700.  Vernard GamblesVeronda Bekka Qian, PharmD, BCPS  03/15/2016,5:03 AM

## 2016-03-15 NOTE — Discharge Instructions (Signed)
Please start taking over the counter Iron supplements.  You will likely need to take a stool softener of your choice with this as it does cause constipation Hold Metformin until 4/21 prior to resuming this due to contrast used in your procedure Brilenta 90mg  in the morning and 45mg  at night

## 2016-03-16 ENCOUNTER — Other Ambulatory Visit (HOSPITAL_COMMUNITY): Payer: Self-pay | Admitting: Interventional Radiology

## 2016-03-16 DIAGNOSIS — I671 Cerebral aneurysm, nonruptured: Secondary | ICD-10-CM

## 2016-03-22 ENCOUNTER — Telehealth (HOSPITAL_COMMUNITY): Payer: Self-pay

## 2016-03-22 NOTE — Telephone Encounter (Signed)
Called to schedule f/u appt with Dr. Corliss Skainseveshwar, left vm for pt to call back. AW

## 2016-03-27 ENCOUNTER — Other Ambulatory Visit: Payer: Self-pay | Admitting: General Surgery

## 2016-03-27 ENCOUNTER — Ambulatory Visit (HOSPITAL_COMMUNITY)
Admission: RE | Admit: 2016-03-27 | Discharge: 2016-03-27 | Disposition: A | Discharge status: Home / Self Care | Payer: BLUE CROSS/BLUE SHIELD | Source: Ambulatory Visit | Attending: Interventional Radiology | Admitting: Interventional Radiology

## 2016-03-27 DIAGNOSIS — I671 Cerebral aneurysm, nonruptured: Secondary | ICD-10-CM | POA: Diagnosis present

## 2016-03-27 LAB — PLATELET INHIBITION P2Y12: Platelet Function  P2Y12: 15 [PRU] — ABNORMAL LOW (ref 194–418)

## 2016-03-30 ENCOUNTER — Telehealth (HOSPITAL_COMMUNITY): Payer: Self-pay | Admitting: *Deleted

## 2016-03-30 NOTE — Telephone Encounter (Signed)
Called and spoke with patient. Per Dr. Reginia Fortseveshwars instruction pt is to continue ASA 81mg  daily and Brilinta will now be 1/2 tab (45mg )  in am and 1/2 tab(45mg ) in pm.  Pt voiced understanding and repeated instructions back to me.  Retuen in 2 weeks for repeat lab draw

## 2016-04-04 ENCOUNTER — Telehealth (HOSPITAL_COMMUNITY): Payer: Self-pay | Admitting: *Deleted

## 2016-04-04 LAB — CBC WITH DIFFERENTIAL/PLATELET
BASOS PCT: 1 %
Basophils Absolute: 0.1 10*3/uL (ref 0.0–0.1)
EOS PCT: 2 %
Eosinophils Absolute: 0.1 10*3/uL (ref 0.0–0.7)
HCT: 28.7 % — ABNORMAL LOW (ref 36.0–46.0)
Hemoglobin: 8.5 g/dL — ABNORMAL LOW (ref 12.0–15.0)
LYMPHS ABS: 1.9 10*3/uL (ref 0.7–4.0)
Lymphocytes Relative: 27 %
MCH: 21.6 pg — AB (ref 26.0–34.0)
MCHC: 29.6 g/dL — AB (ref 30.0–36.0)
MCV: 72.8 fL — AB (ref 78.0–100.0)
MONO ABS: 0.5 10*3/uL (ref 0.1–1.0)
Monocytes Relative: 7 %
Neutro Abs: 4.5 10*3/uL (ref 1.7–7.7)
Neutrophils Relative %: 63 %
PLATELETS: 290 10*3/uL (ref 150–400)
RBC: 3.94 MIL/uL (ref 3.87–5.11)
RDW: 15.8 % — AB (ref 11.5–15.5)
WBC: 7.1 10*3/uL (ref 4.0–10.5)

## 2016-04-04 LAB — PLATELET INHIBITION P2Y12: Platelet Function  P2Y12: 165 [PRU] — ABNORMAL LOW (ref 194–418)

## 2016-04-04 NOTE — Telephone Encounter (Signed)
Called patient and gave her lab results.  Per Dr. Corliss Skainseveshwar reviewed her results and wants her to continue taking Brilinta 1/2tab in am  and pm. ASA 81mg  daily.  Pt voiced understanding.  Asked how her shortness of breath was, she reports that it has improved.

## 2016-05-23 ENCOUNTER — Telehealth (HOSPITAL_COMMUNITY): Payer: Self-pay | Admitting: Interventional Radiology

## 2016-05-23 ENCOUNTER — Telehealth (HOSPITAL_COMMUNITY): Payer: Self-pay | Admitting: *Deleted

## 2016-05-23 NOTE — Telephone Encounter (Signed)
Have made two attempts to contact pt via phone, left VM messages each time for her to call concerning her upcoming procedure. JM

## 2016-05-25 ENCOUNTER — Other Ambulatory Visit (HOSPITAL_COMMUNITY): Payer: Self-pay | Admitting: Interventional Radiology

## 2016-05-25 DIAGNOSIS — I671 Cerebral aneurysm, nonruptured: Secondary | ICD-10-CM

## 2016-05-31 ENCOUNTER — Telehealth (HOSPITAL_COMMUNITY): Payer: Self-pay | Admitting: Interventional Radiology

## 2016-05-31 ENCOUNTER — Other Ambulatory Visit: Payer: Self-pay | Admitting: Radiology

## 2016-05-31 LAB — PLATELET INHIBITION P2Y12: PLATELET FUNCTION P2Y12: 33 [PRU] — AB (ref 194–418)

## 2016-05-31 NOTE — Telephone Encounter (Signed)
Called pt, advised her that per Dr. Corliss Skainseveshwar she will need to only take Brilinta 45mg  and Aspirin 81mg  in the morning. No evening dose of Brilinta. She states understanding. JM

## 2016-06-01 ENCOUNTER — Telehealth (HOSPITAL_COMMUNITY): Payer: Self-pay | Admitting: Interventional Radiology

## 2016-06-01 ENCOUNTER — Encounter (HOSPITAL_COMMUNITY): Payer: Self-pay | Admitting: *Deleted

## 2016-06-01 NOTE — Progress Notes (Signed)
Cassandra Schmidt reports that CBG's have been running around 150.. I check my blood sugar 1 time a day, in the morning and again if I am not feeling well. I instructed patient to not take Glucotrol XL ton Sunday night. I instructed patient to check CBG to check CBG and if it is less than 70 to treat it with Glucose Gel, Glucose tablets or 1/2 cup of clear juice like apple juice or cranberry juice, or 1/2 cup of regular soda. (not cream soda). I instructed patient to recheck CBG in 15 minutes and if CBG is not greater than 70, to  Call [phone] (pre- op). If it is before pre-op opens to retreat as before and recheck CBG in 15 minutes. I told patient to make note of time that liquid is taken and amount, that surgical time may have to be adjusted.

## 2016-06-01 NOTE — Telephone Encounter (Signed)
Pt called and left VM wanting to know how long she would be out of work after her upcoming procedure. I called her back and told her she would need to be out for 2 weeks until her 2 week follow-up visit with Dr. Corliss Skainseveshwar. She states understanding and is in agreement w/ this plan of care. JM

## 2016-06-04 ENCOUNTER — Encounter (HOSPITAL_COMMUNITY)
Admission: AD | Disposition: A | Discharge status: Home / Self Care | Payer: Self-pay | Source: Ambulatory Visit | Attending: Interventional Radiology

## 2016-06-04 ENCOUNTER — Inpatient Hospital Stay (HOSPITAL_COMMUNITY)
Admission: AD | Admit: 2016-06-04 | Discharge: 2016-06-05 | DRG: 027 | Disposition: A | Discharge status: Home / Self Care | Payer: BLUE CROSS/BLUE SHIELD | Source: Ambulatory Visit | Attending: Interventional Radiology | Admitting: Interventional Radiology

## 2016-06-04 ENCOUNTER — Ambulatory Visit (HOSPITAL_COMMUNITY): Payer: BLUE CROSS/BLUE SHIELD | Admitting: Anesthesiology

## 2016-06-04 ENCOUNTER — Encounter (HOSPITAL_COMMUNITY): Payer: Self-pay | Admitting: Anesthesiology

## 2016-06-04 ENCOUNTER — Ambulatory Visit (HOSPITAL_COMMUNITY)
Admission: RE | Admit: 2016-06-04 | Discharge: 2016-06-04 | Disposition: A | Discharge status: Home / Self Care | Payer: BLUE CROSS/BLUE SHIELD | Source: Ambulatory Visit | Attending: Interventional Radiology | Admitting: Interventional Radiology

## 2016-06-04 ENCOUNTER — Encounter (HOSPITAL_COMMUNITY): Payer: Self-pay | Admitting: *Deleted

## 2016-06-04 ENCOUNTER — Encounter (HOSPITAL_COMMUNITY): Payer: Self-pay

## 2016-06-04 DIAGNOSIS — F419 Anxiety disorder, unspecified: Secondary | ICD-10-CM | POA: Diagnosis present

## 2016-06-04 DIAGNOSIS — Z8673 Personal history of transient ischemic attack (TIA), and cerebral infarction without residual deficits: Secondary | ICD-10-CM

## 2016-06-04 DIAGNOSIS — I671 Cerebral aneurysm, nonruptured: Principal | ICD-10-CM | POA: Diagnosis present

## 2016-06-04 DIAGNOSIS — H9312 Tinnitus, left ear: Secondary | ICD-10-CM | POA: Diagnosis present

## 2016-06-04 DIAGNOSIS — Z7984 Long term (current) use of oral hypoglycemic drugs: Secondary | ICD-10-CM

## 2016-06-04 DIAGNOSIS — E119 Type 2 diabetes mellitus without complications: Secondary | ICD-10-CM | POA: Diagnosis present

## 2016-06-04 DIAGNOSIS — Z880 Allergy status to penicillin: Secondary | ICD-10-CM

## 2016-06-04 DIAGNOSIS — I770 Arteriovenous fistula, acquired: Secondary | ICD-10-CM | POA: Diagnosis present

## 2016-06-04 DIAGNOSIS — Z7982 Long term (current) use of aspirin: Secondary | ICD-10-CM

## 2016-06-04 DIAGNOSIS — M1990 Unspecified osteoarthritis, unspecified site: Secondary | ICD-10-CM | POA: Diagnosis present

## 2016-06-04 DIAGNOSIS — Z87891 Personal history of nicotine dependence: Secondary | ICD-10-CM | POA: Diagnosis not present

## 2016-06-04 DIAGNOSIS — Z91030 Bee allergy status: Secondary | ICD-10-CM

## 2016-06-04 DIAGNOSIS — F329 Major depressive disorder, single episode, unspecified: Secondary | ICD-10-CM | POA: Diagnosis present

## 2016-06-04 DIAGNOSIS — Z881 Allergy status to other antibiotic agents status: Secondary | ICD-10-CM

## 2016-06-04 DIAGNOSIS — Z79899 Other long term (current) drug therapy: Secondary | ICD-10-CM

## 2016-06-04 HISTORY — DX: Pneumonia, unspecified organism: J18.9

## 2016-06-04 HISTORY — PX: RADIOLOGY WITH ANESTHESIA: SHX6223

## 2016-06-04 LAB — CBC WITH DIFFERENTIAL/PLATELET
BASOS ABS: 0 10*3/uL (ref 0.0–0.1)
BASOS PCT: 1 %
EOS ABS: 0.1 10*3/uL (ref 0.0–0.7)
Eosinophils Relative: 2 %
HEMATOCRIT: 28 % — AB (ref 36.0–46.0)
HEMOGLOBIN: 7.9 g/dL — AB (ref 12.0–15.0)
LYMPHS ABS: 1.2 10*3/uL (ref 0.7–4.0)
LYMPHS PCT: 29 %
MCH: 19.7 pg — AB (ref 26.0–34.0)
MCHC: 28.2 g/dL — AB (ref 30.0–36.0)
MCV: 69.7 fL — ABNORMAL LOW (ref 78.0–100.0)
Monocytes Absolute: 0.3 10*3/uL (ref 0.1–1.0)
Monocytes Relative: 6 %
Neutro Abs: 2.7 10*3/uL (ref 1.7–7.7)
Neutrophils Relative %: 62 %
Platelets: 290 10*3/uL (ref 150–400)
RBC: 4.02 MIL/uL (ref 3.87–5.11)
RDW: 19.5 % — AB (ref 11.5–15.5)
WBC: 4.3 10*3/uL (ref 4.0–10.5)

## 2016-06-04 LAB — COMPREHENSIVE METABOLIC PANEL
ALBUMIN: 3.3 g/dL — AB (ref 3.5–5.0)
ALK PHOS: 56 U/L (ref 38–126)
ALT: 53 U/L (ref 14–54)
ANION GAP: 8 (ref 5–15)
AST: 109 U/L — ABNORMAL HIGH (ref 15–41)
BILIRUBIN TOTAL: 0.8 mg/dL (ref 0.3–1.2)
BUN: 9 mg/dL (ref 6–20)
CALCIUM: 8.8 mg/dL — AB (ref 8.9–10.3)
CO2: 23 mmol/L (ref 22–32)
Chloride: 107 mmol/L (ref 101–111)
Creatinine, Ser: 0.56 mg/dL (ref 0.44–1.00)
GFR calc non Af Amer: >60 mL/min (ref >60)
Glucose, Bld: 154 mg/dL — ABNORMAL HIGH (ref 65–99)
POTASSIUM: 3.5 mmol/L (ref 3.5–5.1)
SODIUM: 138 mmol/L (ref 135–145)
TOTAL PROTEIN: 6.3 g/dL — AB (ref 6.5–8.1)

## 2016-06-04 LAB — APTT: APTT: 25 s (ref 24–37)

## 2016-06-04 LAB — PROTIME-INR
INR: 1.09 (ref 0.00–1.49)
PROTHROMBIN TIME: 14.3 s (ref 11.6–15.2)

## 2016-06-04 LAB — GLUCOSE, CAPILLARY
GLUCOSE-CAPILLARY: 137 mg/dL — AB (ref 65–99)
GLUCOSE-CAPILLARY: 140 mg/dL — AB (ref 65–99)
GLUCOSE-CAPILLARY: 199 mg/dL — AB (ref 65–99)
Glucose-Capillary: 125 mg/dL — ABNORMAL HIGH (ref 65–99)
Glucose-Capillary: 244 mg/dL — ABNORMAL HIGH (ref 65–99)

## 2016-06-04 LAB — HCG, SERUM, QUALITATIVE: PREG SERUM: NEGATIVE

## 2016-06-04 LAB — PLATELET INHIBITION P2Y12: Platelet Function  P2Y12: 43 [PRU] — ABNORMAL LOW (ref 194–418)

## 2016-06-04 LAB — POCT ACTIVATED CLOTTING TIME
ACTIVATED CLOTTING TIME: 147 s
Activated Clotting Time: 191 seconds

## 2016-06-04 LAB — MRSA PCR SCREENING: MRSA by PCR: NEGATIVE

## 2016-06-04 SURGERY — RADIOLOGY WITH ANESTHESIA
Anesthesia: General

## 2016-06-04 MED ORDER — ACETAMINOPHEN 500 MG PO TABS
1000.0000 mg | ORAL_TABLET | Freq: Four times a day (QID) | ORAL | Status: DC | PRN
  Administered 2016-06-04 – 2016-06-05 (×2): 1000 mg via ORAL
  Filled 2016-06-04 (×2): qty 2

## 2016-06-04 MED ORDER — SODIUM CHLORIDE 0.9 % IV SOLN
INTRAVENOUS | Status: DC | PRN
  Administered 2016-06-04: 08:00:00 via INTRAVENOUS

## 2016-06-04 MED ORDER — OXYCODONE HCL 5 MG/5ML PO SOLN: 5.0000 mg | Freq: Once | ORAL | Status: DC | PRN

## 2016-06-04 MED ORDER — ASPIRIN EC 325 MG PO TBEC
325.0000 mg | DELAYED_RELEASE_TABLET | ORAL | Status: DC
  Filled 2016-06-04: qty 1

## 2016-06-04 MED ORDER — LINAGLIPTIN 5 MG PO TABS
5.0000 mg | ORAL_TABLET | Freq: Every day | ORAL | Status: DC
  Administered 2016-06-05: 5 mg via ORAL
  Filled 2016-06-04: qty 1

## 2016-06-04 MED ORDER — LIDOCAINE HCL (CARDIAC) 20 MG/ML IV SOLN
INTRAVENOUS | Status: DC | PRN
  Administered 2016-06-04: 50 mg via INTRAVENOUS

## 2016-06-04 MED ORDER — VITAMIN D 1000 UNITS PO TABS
2000.0000 [IU] | ORAL_TABLET | Freq: Every day | ORAL | Status: DC
Start: 2016-06-05 — End: 2016-06-05
  Administered 2016-06-05: 2000 [IU] via ORAL
  Filled 2016-06-04: qty 2

## 2016-06-04 MED ORDER — OXYCODONE HCL 5 MG PO TABS: 5.0000 mg | ORAL_TABLET | Freq: Once | ORAL | Status: DC | PRN

## 2016-06-04 MED ORDER — MIDAZOLAM HCL 5 MG/5ML IJ SOLN
INTRAMUSCULAR | Status: DC | PRN
  Administered 2016-06-04: 2 mg via INTRAVENOUS

## 2016-06-04 MED ORDER — CLOPIDOGREL BISULFATE 75 MG PO TABS
75.0000 mg | ORAL_TABLET | ORAL | Status: DC
  Filled 2016-06-04: qty 1

## 2016-06-04 MED ORDER — NICARDIPINE HCL IN NACL 20-0.86 MG/200ML-% IV SOLN: 5.0000 mg/h | INTRAVENOUS | Status: DC

## 2016-06-04 MED ORDER — HEPARIN SODIUM (PORCINE) 1000 UNIT/ML IJ SOLN
INTRAMUSCULAR | Status: DC | PRN
  Administered 2016-06-04: 3000 [IU] via INTRAVENOUS
  Administered 2016-06-04: 500 [IU] via INTRAVENOUS
  Administered 2016-06-04: 1000 [IU] via INTRAVENOUS

## 2016-06-04 MED ORDER — BLACK COHOSH 540 MG PO CAPS: 1.0000 | ORAL_CAPSULE | Freq: Every day | ORAL | Status: DC

## 2016-06-04 MED ORDER — PROTAMINE SULFATE 10 MG/ML IV SOLN
INTRAVENOUS | Status: DC | PRN
  Administered 2016-06-04: 5 mg via INTRAVENOUS

## 2016-06-04 MED ORDER — GLIPIZIDE ER 5 MG PO TB24
5.0000 mg | ORAL_TABLET | Freq: Every evening | ORAL | Status: DC | PRN
  Administered 2016-06-04: 5 mg via ORAL
  Filled 2016-06-04 (×3): qty 1

## 2016-06-04 MED ORDER — NITROGLYCERIN 1 MG/10 ML FOR IR/CATH LAB
INTRA_ARTERIAL | Status: AC
  Filled 2016-06-04: qty 10

## 2016-06-04 MED ORDER — ACETAMINOPHEN 160 MG/5ML PO SOLN: 325.0000 mg | ORAL | Status: DC | PRN

## 2016-06-04 MED ORDER — DEXTROSE 5 % IV SOLN
10.0000 mg | INTRAVENOUS | Status: DC | PRN
  Administered 2016-06-04: 20 ug/min via INTRAVENOUS

## 2016-06-04 MED ORDER — ACETAMINOPHEN 650 MG RE SUPP: 650.0000 mg | Freq: Four times a day (QID) | RECTAL | Status: DC | PRN

## 2016-06-04 MED ORDER — PROPOFOL 10 MG/ML IV BOLUS
INTRAVENOUS | Status: DC | PRN
  Administered 2016-06-04 (×2): 30 mg via INTRAVENOUS
  Administered 2016-06-04: 80 mg via INTRAVENOUS

## 2016-06-04 MED ORDER — B COMPLEX-C PO TABS
1.0000 | ORAL_TABLET | Freq: Every day | ORAL | Status: DC
  Administered 2016-06-05: 1 via ORAL
  Filled 2016-06-04 (×2): qty 1

## 2016-06-04 MED ORDER — VECURONIUM BROMIDE 10 MG IV SOLR
INTRAVENOUS | Status: DC | PRN
  Administered 2016-06-04: 7 mg via INTRAVENOUS
  Administered 2016-06-04: 2 mg via INTRAVENOUS
  Administered 2016-06-04: 3 mg via INTRAVENOUS

## 2016-06-04 MED ORDER — ASPIRIN 325 MG PO TABS
325.0000 mg | ORAL_TABLET | Freq: Every day | ORAL | Status: DC
  Administered 2016-06-05: 325 mg via ORAL
  Filled 2016-06-04: qty 1

## 2016-06-04 MED ORDER — IOPAMIDOL (ISOVUE-300) INJECTION 61%
INTRAVENOUS | Status: AC
  Filled 2016-06-04: qty 300

## 2016-06-04 MED ORDER — VANCOMYCIN HCL IN DEXTROSE 1-5 GM/200ML-% IV SOLN
INTRAVENOUS | Status: AC
Start: 2016-06-04 — End: 2016-06-04
  Filled 2016-06-04: qty 200

## 2016-06-04 MED ORDER — FENTANYL CITRATE (PF) 100 MCG/2ML IJ SOLN
INTRAMUSCULAR | Status: DC | PRN
  Administered 2016-06-04 (×2): 100 ug via INTRAVENOUS

## 2016-06-04 MED ORDER — TICAGRELOR 90 MG PO TABS
45.0000 mg | ORAL_TABLET | Freq: Two times a day (BID) | ORAL | Status: DC
  Administered 2016-06-04 – 2016-06-05 (×2): 45 mg via ORAL
  Filled 2016-06-04 (×3): qty 1

## 2016-06-04 MED ORDER — FENTANYL CITRATE (PF) 100 MCG/2ML IJ SOLN: 25.0000 ug | INTRAMUSCULAR | Status: DC | PRN

## 2016-06-04 MED ORDER — SUGAMMADEX SODIUM 200 MG/2ML IV SOLN
INTRAVENOUS | Status: DC | PRN
  Administered 2016-06-04: 300 mg via INTRAVENOUS

## 2016-06-04 MED ORDER — SODIUM CHLORIDE 0.9 % IV SOLN
INTRAVENOUS | Status: DC
Start: 2016-06-04 — End: 2016-06-05
  Administered 2016-06-04 (×2): via INTRAVENOUS

## 2016-06-04 MED ORDER — PHENYLEPHRINE HCL 10 MG/ML IJ SOLN
INTRAMUSCULAR | Status: DC | PRN
  Administered 2016-06-04 (×3): 40 ug via INTRAVENOUS

## 2016-06-04 MED ORDER — ONDANSETRON HCL 4 MG/2ML IJ SOLN
INTRAMUSCULAR | Status: DC | PRN
  Administered 2016-06-04: 4 mg via INTRAVENOUS

## 2016-06-04 MED ORDER — SODIUM CHLORIDE 0.9 % IV SOLN
Freq: Once | INTRAVENOUS | Status: AC
  Administered 2016-06-04 (×2): via INTRAVENOUS

## 2016-06-04 MED ORDER — VANCOMYCIN HCL 1000 MG IV SOLR
1000.0000 mg | INTRAVENOUS | Status: AC
  Administered 2016-06-04: 1000 mg via INTRAVENOUS
  Filled 2016-06-04: qty 1000

## 2016-06-04 MED ORDER — NIMODIPINE 30 MG PO CAPS
0.0000 mg | ORAL_CAPSULE | ORAL | Status: DC
  Filled 2016-06-04: qty 2

## 2016-06-04 MED ORDER — ACETAMINOPHEN 325 MG PO TABS: 325.0000 mg | ORAL_TABLET | ORAL | Status: DC | PRN

## 2016-06-04 MED ORDER — ACETAMINOPHEN 325 MG PO TABS: 650.0000 mg | ORAL_TABLET | ORAL | Status: DC | PRN

## 2016-06-04 MED ORDER — ONDANSETRON HCL 4 MG/2ML IJ SOLN: 4.0000 mg | Freq: Four times a day (QID) | INTRAMUSCULAR | Status: DC | PRN

## 2016-06-04 NOTE — Anesthesia Preprocedure Evaluation (Addendum)
Anesthesia Evaluation  Patient identified by MRN, date of birth, ID band Patient awake    Reviewed: Allergy & Precautions, NPO status , Patient's Chart, lab work & pertinent test results  History of Anesthesia Complications Negative for: history of anesthetic complications  Airway Mallampati: II  TM Distance: >3 FB Neck ROM: Full    Dental  (+) Upper Dentures, Lower Dentures, Dental Advisory Given   Pulmonary former smoker,    breath sounds clear to auscultation       Cardiovascular + Peripheral Vascular Disease   Rhythm:Regular Rate:Normal     Neuro/Psych  Headaches, CVA, No Residual Symptoms    GI/Hepatic negative GI ROS, Neg liver ROS,   Endo/Other  diabetes, Well Controlled, Type 2, Oral Hypoglycemic Agents  Renal/GU negative Renal ROS     Musculoskeletal   Abdominal   Peds  Hematology   Anesthesia Other Findings   Reproductive/Obstetrics                            Anesthesia Physical Anesthesia Plan  ASA: III  Anesthesia Plan: General   Post-op Pain Management:    Induction: Intravenous  Airway Management Planned: Oral ETT  Additional Equipment: Arterial line  Intra-op Plan:   Post-operative Plan: Extubation in OR and Possible Post-op intubation/ventilation  Informed Consent: I have reviewed the patients History and Physical, chart, labs and discussed the procedure including the risks, benefits and alternatives for the proposed anesthesia with the patient or authorized representative who has indicated his/her understanding and acceptance.   Dental advisory given  Plan Discussed with: CRNA and Surgeon  Anesthesia Plan Comments:         Anesthesia Quick Evaluation

## 2016-06-04 NOTE — Anesthesia Procedure Notes (Signed)
Procedure Name: Intubation Date/Time: 06/04/2016 9:49 AM Performed by: Lovie CholOCK, Dailin Sosnowski K Pre-anesthesia Checklist: Patient identified, Emergency Drugs available, Suction available and Patient being monitored Patient Re-evaluated:Patient Re-evaluated prior to inductionOxygen Delivery Method: Circle System Utilized Preoxygenation: Pre-oxygenation with 100% oxygen Intubation Type: IV induction Ventilation: Mask ventilation without difficulty and Oral airway inserted - appropriate to patient size Laryngoscope Size: Miller and 2 Grade View: Grade I Tube type: Oral Tube size: 7.0 mm Number of attempts: 1 Airway Equipment and Method: Stylet and Oral airway Placement Confirmation: ETT inserted through vocal cords under direct vision,  positive ETCO2 and breath sounds checked- equal and bilateral Secured at: 21 cm Tube secured with: Tape Dental Injury: Teeth and Oropharynx as per pre-operative assessment

## 2016-06-04 NOTE — Sedation Documentation (Signed)
6 [place] Exoseal by ConocoPhillipsMelinda Alewine, RT.  Manual pressure held.

## 2016-06-04 NOTE — Transfer of Care (Signed)
Immediate Anesthesia Transfer of Care Note  Patient: Cassandra Schmidt  Procedure(s) Performed: Procedure(s): EMBOLIZATION   (RADIOLOGY WITH ANESTHESIA) (N/A)  Patient Location: PACU  Anesthesia Type:General  Level of Consciousness: awake, oriented and patient cooperative  Airway & Oxygen Therapy: Patient Spontanous Breathing and Patient connected to nasal cannula oxygen  Post-op Assessment: Report given to RN and Post -op Vital signs reviewed and stable  Post vital signs: Reviewed  Last Vitals: There were no vitals filed for this visit.  Last Pain: There were no vitals filed for this visit.    Patients Stated Pain Goal: 5 (06/04/16 0710)  Complications: No apparent anesthesia complications

## 2016-06-04 NOTE — H&P (Signed)
Chief Complaint: Patient was seen in consultation today for cerebral arteriogram with left dural arteriovenous fistula embolization at the request of Dr Eliezer ChampagneJason VanEyk  Referring Physician(s): Dr Eliezer ChampagneJason VanEyk  Supervising Physician: Julieanne Cottoneveshwar, Sanjeev  Patient Status: Outpatient  History of Present Illness: Cassandra JeansCarol J Schmidt is a 50 y.o. female   [facility] Anterior communicating artery aneurysm embolization 03/14/16 Has done well since then Denies any further headache Does continue to experience Left tinnitus daily Consultation 03/27/2016: The angiograms revealing the left-sided the dural arteriovenous fistula supplied by the middle meningeal artery and the left occipital artery were also brought to their attention. Patient would like to have this treated endovascularly if possible.  Scheduled today for left dural arteriovenous fistula embolization in IR with Dr Corliss Skainseveshwar   Past Medical History  Diagnosis Date  . Depression   . Anxiety   . Headache   . Arthritis   . Diabetes mellitus without complication (HCC)     Type II  . Stroke [facility](HCC)     no residual effects  . Pneumonia     06/01/16- greater- than 5 years ago    Past Surgical History  Procedure Laterality Date  . Cesarean section      3 C-SECTIONS  . Tonsillectomy    . Radiology with anesthesia N/A 02/20/2016    Procedure: RADIOLOGY WITH ANESTHESIA;  Surgeon: Julieanne CottonSanjeev Deveshwar, MD;  [facility];  Service: Radiology;  Laterality: N/A;  . Tubal ligation    . Radiology with anesthesia N/A 03/14/2016    Procedure: EMBOLIZATION;  Surgeon: Julieanne CottonSanjeev Deveshwar, MD;  [facility];  Service: Radiology;  Laterality: N/A;    Allergies: Keflex; Bee venom; and Penicillins  Medications: Prior to Admission medications   Medication Sig Start Date End Date Taking? Authorizing Provider  acetaminophen (TYLENOL) 325 MG tablet Take 650 mg by mouth every 6 (six) hours as needed.    Historical Provider, MD  aspirin 81 MG  chewable tablet Chew 1 tablet (81 mg total) by mouth daily. 03/15/16   Barnetta ChapelKelly Osborne, PA-C  b complex vitamins tablet Take 1 tablet by mouth daily.    Historical Provider, MD  Black Cohosh 540 MG CAPS Take 1 capsule by mouth daily.    Historical Provider, MD  Cholecalciferol (VITAMIN D-3) 1000 units CAPS Take 2 capsules by mouth daily.     Historical Provider, MD  glipiZIDE (GLUCOTROL XL) 5 MG 24 hr tablet Take 5 mg by mouth at bedtime as needed (if blood sugar >150). Reported on 03/09/2016    Historical Provider, MD  ibuprofen (ADVIL,MOTRIN) 200 MG tablet Take 200 mg by mouth every 6 (six) hours as needed.    Historical Provider, MD  metFORMIN (GLUMETZA) 500 MG (MOD) 24 hr tablet Take 1,000 mg by mouth daily with supper.    Historical Provider, MD  sitaGLIPtin (JANUVIA) 100 MG tablet Take 100 mg by mouth every morning.     Historical Provider, MD  ticagrelor (BRILINTA) 90 MG TABS tablet Take 45 mg by mouth every morning.     Historical Provider, MD     History reviewed. No pertinent family history.  Social History   Social History  . Marital Status: Married    Spouse Name: N/A  . Number of Children: N/A  . Years of Education: N/A   Social History Main Topics  . Smoking status: Former Smoker -- 8 years  . Smokeless tobacco: Never Used     Comment:  quit 2000  . Alcohol Use: No  .  Drug Use: No  . Sexual Activity: Not Asked   Other Topics Concern  . None   Social History Narrative     Review of Systems: A 12 point ROS discussed and pertinent positives are indicated in the HPI above.  All other systems are negative.  Review of Systems  Constitutional: Negative for fever, activity change, appetite change and fatigue.  HENT: Positive for tinnitus. Negative for ear pain, hearing loss and trouble swallowing.   Eyes: Negative for visual disturbance.  Respiratory: Negative for shortness of breath.   Cardiovascular: Negative for chest pain.  Gastrointestinal: Negative for abdominal  pain.  Musculoskeletal: Negative for gait problem.  Neurological: Negative for dizziness, tremors, seizures, syncope, facial asymmetry, speech difficulty, weakness, light-headedness, numbness and headaches.  Psychiatric/Behavioral: Negative for behavioral problems and confusion.    Vital Signs: BP 116/64 mmHg  Pulse 79  Temp(Src) 98.1 F (36.7 C)  Resp 18  Ht 5\' 3"  (1.6 m)  Wt 155 lb (70.308 kg)  BMI 27.46 kg/m2  SpO2 100%  LMP 05/24/2016  Physical Exam  Constitutional: She is oriented to person, place, and time. She appears well-nourished.  HENT:  Head: Atraumatic.  Eyes: EOM are normal.  Neck: Neck supple.  Cardiovascular: Normal rate, regular rhythm and normal heart sounds.   No murmur heard. Pulmonary/Chest: Effort normal and breath sounds normal. She has no wheezes.  Abdominal: Soft. Bowel sounds are normal. There is no tenderness.  Musculoskeletal: Normal range of motion. She exhibits no edema or tenderness.  Neurological: She is alert and oriented to person, place, and time.  Skin: Skin is warm and dry.  Psychiatric: She has a normal mood and affect. Her behavior is normal. Judgment and thought content normal.  Nursing note and vitals reviewed.   Mallampati Score:  MD Evaluation Airway: WNL Heart: WNL Abdomen: WNL Chest/ Lungs: WNL ASA  Classification: 2 Mallampati/Airway Score: One  Imaging: No results found.  Labs:  CBC:  Recent Labs  03/15/16 0315 03/15/16 0550 04/04/16 1430 06/04/16 0640  WBC 4.5 4.7 7.1 4.3  HGB 6.9* 6.9* 8.5* 7.9*  HCT 23.8* 23.6* 28.7* 28.0*  PLT 215 228 290 290    COAGS:  Recent Labs  12/30/15 0818 02/01/16 1452 02/20/16 0706 03/12/16 1001 06/04/16 0640  INR 1.03 1.01 1.13 1.09 1.09  APTT 26 26 25   --  25    BMP:  Recent Labs  02/20/16 0706 03/12/16 1001 03/15/16 0315 06/04/16 0640  NA 140 140 142 138  K 3.6 3.8 3.5 3.5  CL 108 107 112* 107  CO2 22 22 20* 23  GLUCOSE 143* 139* 156* 154*  BUN 8 8  <5* 9  CALCIUM 8.7* 9.2 7.9* 8.8*  CREATININE 0.60 0.54 0.59 0.56  GFRNONAA >60 >60 >60 >60  GFRAA >60 >60 >60 >60    LIVER FUNCTION TESTS:  Recent Labs  02/01/16 1452 02/20/16 0706 03/12/16 1001 06/04/16 0640  BILITOT 0.3 0.6 0.7 0.8  AST 23 19 19  109*  ALT 16 14 16  53  ALKPHOS 54 49 55 56  PROT 7.0 6.0* 7.1 6.3*  ALBUMIN 3.7 3.3* 3.8 3.3*    TUMOR MARKERS: No results for input(s): AFPTM, CEA, CA199, CHROMGRNA in the last 8760 hours.  Assessment and Plan:  Hx Anterior communicating artery aneurysm embolization 02/2016 Headaches have resolved Continued Left ear tinnitus daily Now scheduled for L AVF embolization Risks and Benefits discussed with the patient including, but not limited to bleeding, infection, vascular injury, contrast induced renal failure, stroke or  even death. All of the patient's questions were answered, patient is agreeable to proceed. Consent signed and in chart. She is aware and agreeable;  if intervention is performed she will be admitted overnight into Neuro ICU Plan will be for discharge following day  Thank you for this interesting consult.  I greatly enjoyed meeting CITLALLI WEIKEL and look forward to participating in their care.  A copy of this report was sent to the requesting provider on this date.  Electronically Signed: Ambriana Selway A 06/04/2016, 7:45 AM   I spent a total of  30 Minutes   in face to face in clinical consultation, greater than 50% of which was counseling/coordinating care for left dural AVF embolization

## 2016-06-04 NOTE — Sedation Documentation (Signed)
Right femoral artery accessed by Dr Corliss Skainseveshwar

## 2016-06-04 NOTE — Progress Notes (Signed)
PHARMACIST - PHYSICIAN ORDER COMMUNICATION  CONCERNING: P&T Medication Policy on Herbal Medications  DESCRIPTION:  This patient's order for:  Cassandra BeckmannBlack Kohosh  has been noted.  This product(s) is classified as an "herbal" or natural product. Due to a lack of definitive safety studies or FDA approval, nonstandard manufacturing practices, plus the potential risk of unknown drug-drug interactions while on inpatient medications, the Pharmacy and Therapeutics Committee does not permit the use of "herbal" or natural products of this type within [facility].   ACTION TAKEN: The pharmacy department is unable to verify this order at this time. Please reevaluate patient's clinical condition at discharge and address if the herbal or natural product(s) should be resumed at that time.  Nicolette Bangheresa Luticia Tadros, RPh Pager: [phone] 06/04/2016 4:47 PM

## 2016-06-04 NOTE — H&P (Signed)
Referring Physician(s): Dr Eliezer Champagne  Supervising Physician: Julieanne Cotton  Patient Status:  Inpatient  Chief Complaint:  Left dural arteriovenous fistula Procedures    CEREBRAL ANGIOGRAM [[id number] (Custom)]      Expand All Collapse All   S/P bilateral common carotid arteriograms followed by embolization of Lt mMA and Lt occipital artery with coils and inyx With near complete obliteration of Lt sigmoid sinus DAVF      Subjective:  Has done well post procedure Eating reg diet Has no complaints---except Rt Art line painful Denies N/V Denies Headache Denies visual or speech issues Dr Corliss Skains has seen and evaluated pt  Allergies: Keflex; Bee venom; and Penicillins  Medications: Prior to Admission medications   Medication Sig Start Date End Date Taking? Authorizing Provider  acetaminophen (TYLENOL) 325 MG tablet Take 650 mg by mouth every 6 (six) hours as needed.   Yes Historical Provider, MD  aspirin 81 MG chewable tablet Chew 1 tablet (81 mg total) by mouth daily. 03/15/16  Yes Barnetta Chapel, PA-C  b complex vitamins tablet Take 1 tablet by mouth daily.   Yes Historical Provider, MD  Black Cohosh 540 MG CAPS Take 1 capsule by mouth daily.   Yes Historical Provider, MD  Cholecalciferol (VITAMIN D-3) 1000 units CAPS Take 2 capsules by mouth daily.    Yes Historical Provider, MD  glipiZIDE (GLUCOTROL XL) 5 MG 24 hr tablet Take 5 mg by mouth at bedtime as needed (if blood sugar >150). Reported on 03/09/2016   Yes Historical Provider, MD  ibuprofen (ADVIL,MOTRIN) 200 MG tablet Take 200 mg by mouth every 6 (six) hours as needed.   Yes Historical Provider, MD  metFORMIN (GLUMETZA) 500 MG (MOD) 24 hr tablet Take 1,000 mg by mouth daily with supper.   Yes Historical Provider, MD  sitaGLIPtin (JANUVIA) 100 MG tablet Take 100 mg by mouth every morning.    Yes Historical Provider, MD  ticagrelor (BRILINTA) 90 MG TABS tablet Take 45 mg by mouth every morning.    Yes  Historical Provider, MD     Vital Signs: BP 102/55 mmHg  Pulse 96  Temp(Src) 97.9 F (36.6 C)  Resp 24  SpO2 96%  LMP 05/24/2016  Physical Exam  Constitutional: She is oriented to person, place, and time.  HENT:  Head: Atraumatic.  Face symmetrical Smile=  Eyes: EOM are normal.  Neck: Neck supple.  Cardiovascular: Normal rate and regular rhythm.   Pulmonary/Chest: Effort normal and breath sounds normal.  Abdominal: Soft. Bowel sounds are normal.  Musculoskeletal: Normal range of motion.  Neurological: She is alert and oriented to person, place, and time.  Skin: Skin is warm and dry.  Rt groin NT no bleeding No hematoma Rt foot 2+ pulses  Psychiatric: She has a normal mood and affect. Her behavior is normal. Judgment and thought content normal.  Nursing note and vitals reviewed.   Imaging: No results found.  Labs:  CBC:  Recent Labs  03/15/16 0315 03/15/16 0550 04/04/16 1430 06/04/16 0640  WBC 4.5 4.7 7.1 4.3  HGB 6.9* 6.9* 8.5* 7.9*  HCT 23.8* 23.6* 28.7* 28.0*  PLT 215 228 290 290    COAGS:  Recent Labs  12/30/15 0818 02/01/16 1452 02/20/16 0706 03/12/16 1001 06/04/16 0640  INR 1.03 1.01 1.13 1.09 1.09  APTT --  25    BMP:  Recent Labs  02/20/16 0706 03/12/16 1001 03/15/16 0315 06/04/16 0640  NA 140 140 142 138  K 3.6 3.8  3.5 3.5  CL 108 107 112* 107  CO2 22 22 20* 23  GLUCOSE 143* 139* 156* 154*  BUN 8 8 <5* 9  CALCIUM 8.7* 9.2 7.9* 8.8*  CREATININE 0.60 0.54 0.59 0.56  GFRNONAA >60 >60 >60 >60  GFRAA >60 >60 >60 >60    LIVER FUNCTION TESTS:  Recent Labs  02/01/16 1452 02/20/16 0706 03/12/16 1001 06/04/16 0640  BILITOT 0.3 0.6 0.7 0.8  AST 23 19 19  109*  ALT 16 14 16  53  ALKPHOS 54 49 55 56  PROT 7.0 6.0* 7.1 6.3*  ALBUMIN 3.7 3.3* 3.8 3.3*    Assessment and Plan:  Left Dural AVF Embolization in IR 7/10 with Dr Corliss Skainseveshwar Doing well post procedure' No complaints Plan for dc in am  Electronically  Signed: Jaken Fregia A 06/04/2016, 3:34 PM   I spent a total of 15 Minutes at the the patient's bedside AND on the patient's hospital floor or unit, greater than 50% of which was counseling/coordinating care for embolization Dural AVF

## 2016-06-04 NOTE — Procedures (Signed)
S/P bilateral common carotid arteriograms followed by embolization of Lt mMA and Lt occipital artery with coils and inyx  With near complete obliteration  of   Lt sigmoid sinus DAVF

## 2016-06-05 ENCOUNTER — Encounter (HOSPITAL_COMMUNITY): Payer: Self-pay | Admitting: Interventional Radiology

## 2016-06-05 LAB — CBC WITH DIFFERENTIAL/PLATELET
BASOS PCT: 1 %
Basophils Absolute: 0.1 10*3/uL (ref 0.0–0.1)
EOS PCT: 2 %
Eosinophils Absolute: 0.1 10*3/uL (ref 0.0–0.7)
HCT: 24 % — ABNORMAL LOW (ref 36.0–46.0)
Hemoglobin: 6.7 g/dL — CL (ref 12.0–15.0)
LYMPHS ABS: 1.1 10*3/uL (ref 0.7–4.0)
Lymphocytes Relative: 22 %
MCH: 19.5 pg — AB (ref 26.0–34.0)
MCHC: 27.9 g/dL — AB (ref 30.0–36.0)
MCV: 69.8 fL — AB (ref 78.0–100.0)
MONO ABS: 0.3 10*3/uL (ref 0.1–1.0)
Monocytes Relative: 6 %
NEUTROS ABS: 3.3 10*3/uL (ref 1.7–7.7)
Neutrophils Relative %: 69 %
PLATELETS: 265 10*3/uL (ref 150–400)
RBC: 3.44 MIL/uL — ABNORMAL LOW (ref 3.87–5.11)
RDW: 19.8 % — AB (ref 11.5–15.5)
WBC: 4.9 10*3/uL (ref 4.0–10.5)

## 2016-06-05 LAB — GLUCOSE, CAPILLARY
Glucose-Capillary: 94 mg/dL (ref 65–99)
Glucose-Capillary: 162 mg/dL — ABNORMAL HIGH (ref 65–99)

## 2016-06-05 LAB — BASIC METABOLIC PANEL
Anion gap: 5 (ref 5–15)
CHLORIDE: 110 mmol/L (ref 101–111)
CO2: 24 mmol/L (ref 22–32)
CREATININE: 0.61 mg/dL (ref 0.44–1.00)
Calcium: 7.9 mg/dL — ABNORMAL LOW (ref 8.9–10.3)
GFR calc Af Amer: >60 mL/min (ref >60)
GFR calc non Af Amer: >60 mL/min (ref >60)
GLUCOSE: 205 mg/dL — AB (ref 65–99)
Potassium: 3.7 mmol/L (ref 3.5–5.1)
Sodium: 139 mmol/L (ref 135–145)

## 2016-06-05 LAB — PREPARE RBC (CROSSMATCH)

## 2016-06-05 MED ORDER — SODIUM CHLORIDE 0.9 % IV SOLN: Freq: Once | INTRAVENOUS | Status: DC

## 2016-06-05 NOTE — Anesthesia Postprocedure Evaluation (Signed)
Anesthesia Post Note  Patient: Cassandra Schmidt  Procedure(s) Performed: Procedure(s) (LRB): EMBOLIZATION   (RADIOLOGY WITH ANESTHESIA) (N/A)  Patient location during evaluation: PACU Anesthesia Type: General Level of consciousness: awake Pain management: pain level controlled Vital Signs Assessment: post-procedure vital signs reviewed and stable Respiratory status: spontaneous breathing Cardiovascular status: stable Postop Assessment: no signs of nausea or vomiting Anesthetic complications: no    Last Vitals:  Filed Vitals:   06/05/16 0500 06/05/16 0600  BP: 94/52 108/61  Pulse: 77 83  Temp:    Resp: 15 12    Last Pain:  Filed Vitals:   06/05/16 0620  PainSc: Asleep                 Gean Laursen

## 2016-06-05 NOTE — Progress Notes (Signed)
Pt d/cd to home. Wheeled to friend's car by Agricultural consultantvolunteer. All IVs D/C'd. All discharge information given, and pt states understanding. Pt understands when to return for appt with Dr. D (in 2 weeks) and when to call 911.

## 2016-06-05 NOTE — Discharge Summary (Signed)
Patient ID: Cassandra Schmidt MRN: [id number] DOB/AGE: 48-31-1967 48 y.o.  Admit date: 06/04/2016 Discharge date: 06/05/2016  Supervising Physician: Dr Julieanne CottonSanjeev Deveshwar  Admission Diagnoses: Left Dural Arteriovenous fistula  Discharge Diagnoses:  Active Problems:   Dural arteriovenous fistula   Discharged Condition: stable; improved  Hospital Course: Pt has had previous procedure with Dr Corliss Skainseveshwar 02/2016: Embolization of anterior communicating artery aneurysm. Returns for embolization of Left dural arteriovenous fistula. Procedure performed 06/04/16 in IR with Dr Corliss Skainseveshwar Procedure went well; no complication. Tinnitus has already resolved. Overnight admission to Neuro ICU was without issue. Denies complaint. Noted Hg 6.7 this am (pt is vegan) Transfused 1 unit RBC without event. Plan to have recheck CBC at Dr Leonia ReaderVan Eyk [place] Essex[place] 7/14. Urinating on own; ambulating in hall. Dr Corliss Skainseveshwar has seen and examined pt. Resume all home meds; ASA 81 mg daily; Brilinta 45 mg BID; restart Metformin 7/12. Pt has good understanding of plan and instructions.   Consults: none  Significant Diagnostic Studies: Cerebral arteriogram  Treatments:  Procedures    CEREBRAL ANGIOGRAM [[id number] (Custom)]      Expand All Collapse All   S/P bilateral common carotid arteriograms followed by embolization of Lt mMA and Lt occipital artery with coils and inyx With near complete obliteration of Lt sigmoid sinus DAVF       Discharge Exam: Blood pressure 99/63, pulse 61, temperature 98.8 F (37.1 C), temperature source Oral, resp. rate 17, last menstrual period 05/24/2016, SpO2 98 %.   PE: A/O Appropriate Pleasant Face symmetrical Tongue midline Heart: RRR Lungs CTA Abd: soft +BS; NT No masses Extr FROM; Rt groin NT; no bleeding No hematoma Rt foot 2+ pulses UOP great- yellow; urinating on own  Results for orders placed or performed during the hospital encounter of 06/04/16  MRSA PCR  Screening  Result Value Ref Range   MRSA by PCR NEGATIVE NEGATIVE  hCG, serum, qualitative  Result Value Ref Range   Preg, Serum NEGATIVE NEGATIVE  APTT  Result Value Ref Range   aPTT 25 24 - 37 seconds  CBC WITH DIFFERENTIAL  Result Value Ref Range   WBC 4.3 4.0 - 10.5 K/uL   RBC 4.02 3.87 - 5.11 MIL/uL   Hemoglobin 7.9 (L) 12.0 - 15.0 g/dL   HCT 13.028.0 (L) 86.536.0 - 78.446.0 %   MCV 69.7 (L) 78.0 - 100.0 fL   MCH 19.7 (L) 26.0 - 34.0 pg   MCHC 28.2 (L) 30.0 - 36.0 g/dL   RDW 69.619.5 (H) 29.511.5 - 28.415.5 %   Platelets 290 150 - 400 K/uL   Neutrophils Relative % 62 %   Lymphocytes Relative 29 %   Monocytes Relative 6 %   Eosinophils Relative 2 %   Basophils Relative 1 %   Neutro Abs 2.7 1.7 - 7.7 K/uL   Lymphs Abs 1.2 0.7 - 4.0 K/uL   Monocytes Absolute 0.3 0.1 - 1.0 K/uL   Eosinophils Absolute 0.1 0.0 - 0.7 K/uL   Basophils Absolute 0.0 0.0 - 0.1 K/uL   RBC Morphology POLYCHROMASIA PRESENT   Comprehensive metabolic panel  Result Value Ref Range   Sodium 138 135 - 145 mmol/L   Potassium 3.5 3.5 - 5.1 mmol/L   Chloride 107 101 - 111 mmol/L   CO2 23 22 - 32 mmol/L   Glucose, Bld 154 (H) 65 - 99 mg/dL   BUN 9 6 - 20 mg/dL   Creatinine, Ser 1.320.56 0.44 - 1.00 mg/dL   Calcium 8.8 (L) 8.9 - 10.3 mg/dL  Total Protein 6.3 (L) 6.5 - 8.1 g/dL   Albumin 3.3 (L) 3.5 - 5.0 g/dL   AST 161 (H) 15 - 41 U/L   ALT 53 14 - 54 U/L   Alkaline Phosphatase 56 38 - 126 U/L   Total Bilirubin 0.8 0.3 - 1.2 mg/dL   GFR calc non Af Amer >60 >60 mL/min   GFR calc Af Amer >60 >60 mL/min   Anion gap 8 5 - 15  Platelet inhibition p2y12 (not at [place])  Result Value Ref Range   Platelet Function  P2Y12 43 (L) 194 - 418 PRU  Protime-INR  Result Value Ref Range   Prothrombin Time 14.3 11.6 - 15.2 seconds   INR 1.09 0.00 - 1.49  Glucose, capillary  Result Value Ref Range   Glucose-Capillary 137 (H) 65 - 99 mg/dL  Glucose, capillary  Result Value Ref Range   Glucose-Capillary 140 (H) 65 - 99 mg/dL   Comment 1  Notify RN    Comment 2 Document in Chart   Basic metabolic panel  Result Value Ref Range   Sodium 139 135 - 145 mmol/L   Potassium 3.7 3.5 - 5.1 mmol/L   Chloride 110 101 - 111 mmol/L   CO2 24 22 - 32 mmol/L   Glucose, Bld 205 (H) 65 - 99 mg/dL   BUN <5 (L) 6 - 20 mg/dL   Creatinine, Ser 0.96 0.44 - 1.00 mg/dL   Calcium 7.9 (L) 8.9 - 10.3 mg/dL   GFR calc non Af Amer >60 >60 mL/min   GFR calc Af Amer >60 >60 mL/min   Anion gap 5 5 - 15  CBC WITH DIFFERENTIAL  Result Value Ref Range   WBC 4.9 4.0 - 10.5 K/uL   RBC 3.44 (L) 3.87 - 5.11 MIL/uL   Hemoglobin 6.7 (LL) 12.0 - 15.0 g/dL   HCT 04.5 (L) 40.9 - 81.1 %   MCV 69.8 (L) 78.0 - 100.0 fL   MCH 19.5 (L) 26.0 - 34.0 pg   MCHC 27.9 (L) 30.0 - 36.0 g/dL   RDW 91.4 (H) 78.2 - 95.6 %   Platelets 265 150 - 400 K/uL   Neutrophils Relative % 69 %   Lymphocytes Relative 22 %   Monocytes Relative 6 %   Eosinophils Relative 2 %   Basophils Relative 1 %   Neutro Abs 3.3 1.7 - 7.7 K/uL   Lymphs Abs 1.1 0.7 - 4.0 K/uL   Monocytes Absolute 0.3 0.1 - 1.0 K/uL   Eosinophils Absolute 0.1 0.0 - 0.7 K/uL   Basophils Absolute 0.1 0.0 - 0.1 K/uL   RBC Morphology TEARDROP CELLS   Glucose, capillary  Result Value Ref Range   Glucose-Capillary 244 (H) 65 - 99 mg/dL  Glucose, capillary  Result Value Ref Range   Glucose-Capillary 199 (H) 65 - 99 mg/dL  Glucose, capillary  Result Value Ref Range   Glucose-Capillary 94 65 - 99 mg/dL  Type and screen  Result Value Ref Range   ABO/RH(D) A NEG    Antibody Screen NEG    Sample Expiration 06/07/2016    Unit Number [id number]    Blood Component Type RED CELLS,LR    Unit division 00    Status of Unit ISSUED    Transfusion Status OK TO TRANSFUSE    Crossmatch Result Compatible   Prepare RBC  Result Value Ref Range   Order Confirmation ORDER PROCESSED BY BLOOD BANK     Disposition: Left dural arteriovenous fistula Embolization in IR 7/10  with Dr Corliss Skains No complications Tinnitus  resolved Dr Corliss Skains has seen and examined pt Resume all meds; ASA 81 mg daily Brilinta 45 mg BID Restart Metformin 7/12 Follow up with Dr Corliss Skains 2 weeks Pt has good understanding of discharge instructons   Discharge Instructions    Call MD for:  difficulty breathing, headache or visual disturbances    Complete by:  As directed      Call MD for:  extreme fatigue    Complete by:  As directed      Call MD for:  hives    Complete by:  As directed      Call MD for:  persistant dizziness or light-headedness    Complete by:  As directed      Call MD for:  persistant nausea and vomiting    Complete by:  As directed      Call MD for:  redness, tenderness, or signs of infection (pain, swelling, redness, odor or green/yellow discharge around incision site)    Complete by:  As directed      Call MD for:  severe uncontrolled pain    Complete by:  As directed      Call MD for:  temperature >100.4    Complete by:  As directed      Diet - low sodium heart healthy    Complete by:  As directed      Discharge instructions    Complete by:  As directed   Continue all home meds; ASA 81 mg daily; Brilinta 45 mg BID; restart Metformin 7/12; 2 week follow up with Dr Corliss Skains; pt will hear from scheduler for time and date     Discharge wound care:    Complete by:  As directed   May shower today; replace clean bandaid to Rt groin daily x 1 week     Driving Restrictions    Complete by:  As directed   No driving x 2 weeks  No driving x 2 weeks     Increase activity slowly    Complete by:  As directed      Lifting restrictions    Complete by:  As directed   No lifting over 10 lbs x 2 weeks            Medication List    TAKE these medications        acetaminophen 325 MG tablet  Commonly known as:  TYLENOL  Take 650 mg by mouth every 6 (six) hours as needed.     aspirin 81 MG chewable tablet  Chew 1 tablet (81 mg total) by mouth daily.     b complex vitamins tablet  Take 1 tablet by  mouth daily.     Black Cohosh 540 MG Caps  Take 1 capsule by mouth daily.     glipiZIDE 5 MG 24 hr tablet  Commonly known as:  GLUCOTROL XL  Take 5 mg by mouth at bedtime as needed (if blood sugar >150). Reported on 03/09/2016     ibuprofen 200 MG tablet  Commonly known as:  ADVIL,MOTRIN  Take 200 mg by mouth every 6 (six) hours as needed.     metFORMIN 500 MG (MOD) 24 hr tablet  Commonly known as:  GLUMETZA  Take 1,000 mg by mouth daily with supper.     sitaGLIPtin 100 MG tablet  Commonly known as:  JANUVIA  Take 100 mg by mouth every morning.     ticagrelor 90 MG Tabs tablet  Commonly known as:  BRILINTA  Take 45 mg by mouth every morning.     Vitamin D-3 1000 units Caps  Take 2 capsules by mouth daily.           Follow-up Information    Follow up with DEVESHWAR, Grandville Silos, MD In 2 weeks.   Specialty:  Interventional Radiology   Why:  pt will hear from scheduler for follow up date and time; call [phone] if concerns   Contact information:   [address] [place] [address] [phone]        Electronically Signed: Ralene Muskrat A 06/05/2016, 1:03 PM   I have spent Greater Than 30 Minutes discharging Cassandra Schmidt.

## 2016-06-05 NOTE — Progress Notes (Signed)
Referring Physician(s): Dr Michel SanteeJason Van Eyk  Supervising Physician: Julieanne Cottoneveshwar, Sanjeev  Patient Status:  Inpatient  Chief Complaint:  Left tinnitus L dural arteriovenous fistula Embolization 7/10 in IR with Dr Corliss Skainseveshwar  Subjective:  Pt has done well overnight No pain no headache Denies tinnitus No N/V Foley no out Hg 6.7 early am Will tx 1 unit RBCs per Dr Corliss Skainseveshwar  Allergies: Keflex; Bee venom; and Penicillins  Medications: Prior to Admission medications   Medication Sig Start Date End Date Taking? Authorizing Provider  acetaminophen (TYLENOL) 325 MG tablet Take 650 mg by mouth every 6 (six) hours as needed.   Yes Historical Provider, MD  aspirin 81 MG chewable tablet Chew 1 tablet (81 mg total) by mouth daily. 03/15/16  Yes Barnetta ChapelKelly Osborne, PA-C  b complex vitamins tablet Take 1 tablet by mouth daily.   Yes Historical Provider, MD  Black Cohosh 540 MG CAPS Take 1 capsule by mouth daily.   Yes Historical Provider, MD  Cholecalciferol (VITAMIN D-3) 1000 units CAPS Take 2 capsules by mouth daily.    Yes Historical Provider, MD  glipiZIDE (GLUCOTROL XL) 5 MG 24 hr tablet Take 5 mg by mouth at bedtime as needed (if blood sugar >150). Reported on 03/09/2016   Yes Historical Provider, MD  ibuprofen (ADVIL,MOTRIN) 200 MG tablet Take 200 mg by mouth every 6 (six) hours as needed.   Yes Historical Provider, MD  metFORMIN (GLUMETZA) 500 MG (MOD) 24 hr tablet Take 1,000 mg by mouth daily with supper.   Yes Historical Provider, MD  sitaGLIPtin (JANUVIA) 100 MG tablet Take 100 mg by mouth every morning.    Yes Historical Provider, MD  ticagrelor (BRILINTA) 90 MG TABS tablet Take 45 mg by mouth every morning.    Yes Historical Provider, MD     Vital Signs: BP 108/61 mmHg  Pulse 83  Temp(Src) 97.9 F (36.6 C) (Oral)  Resp 12  SpO2 97%  LMP 05/24/2016  Physical Exam  Constitutional: She is oriented to person, place, and time.  A/O Up in chair Foley out  Cardiovascular:  Normal rate and regular rhythm.   Pulmonary/Chest: Effort normal and breath sounds normal. She has no wheezes.  Abdominal: Soft. Bowel sounds are normal. There is no tenderness.  Neurological: She is alert and oriented to person, place, and time.  Skin: Skin is warm and dry.  Rt groin NT; no bleeding No hematoma Rt foot 2+ pulses  Psychiatric: She has a normal mood and affect. Her behavior is normal. Judgment and thought content normal.  Nursing note and vitals reviewed.   Imaging: No results found.  Labs:  CBC:  Recent Labs  03/15/16 0550 04/04/16 1430 06/04/16 0640 06/05/16 0245  WBC 4.7 7.1 4.3 4.9  HGB 6.9* 8.5* 7.9* 6.7*  HCT 23.6* 28.7* 28.0* 24.0*  PLT 228 290 290 265    COAGS:  Recent Labs  12/30/15 0818 02/01/16 1452 02/20/16 0706 03/12/16 1001 06/04/16 0640  INR 1.03 1.01 1.13 1.09 1.09  APTT 26 26 25   --  25    BMP:  Recent Labs  03/12/16 1001 03/15/16 0315 06/04/16 0640 06/05/16 0245  NA 140 142 138 139  K 3.8 3.5 3.5 3.7  CL 107 112* 107 110  CO2 22 20* 23 24  GLUCOSE 139* 156* 154* 205*  BUN 8 <5* 9 <5*  CALCIUM 9.2 7.9* 8.8* 7.9*  CREATININE 0.54 0.59 0.56 0.61  GFRNONAA >60 >60 >60 >60  GFRAA >60 >60 >60 >60  LIVER FUNCTION TESTS:  Recent Labs  02/01/16 1452 02/20/16 0706 03/12/16 1001 06/04/16 0640  BILITOT 0.3 0.6 0.7 0.8  AST 109*  ALT 53  ALKPHOS 54 49 55 56  PROT 7.0 6.0* 7.1 6.3*  ALBUMIN 3.7 3.3* 3.8 3.3*    Assessment and Plan:  Left dural AVF embolization 7/10 in IR Hg 6.7 this am---for transfusion now Still plan for dc today  Electronically Signed: Estefany Goebel A 06/05/2016, 7:45 AM   I spent a total of 15 Minutes at the the patient's bedside AND on the patient's hospital floor or unit, greater than 50% of which was counseling/coordinating care for L DAVF embo

## 2016-06-05 NOTE — Progress Notes (Signed)
CRITICAL VALUE ALERT  Critical value received:Hgb 6.7  Date of notification:  06/05/16  Time of notification:  0330  Critical value read back:Yes.    Nurse who received alert:  Donnie CoffinLisa W  MD notified (1st page):  Dr. Corliss Skainseveshwar  Time of first page: 0330 MD notified (2nd page):   Time of second page:  Responding MD:  Dr. Corliss Skainseveshwar Time MD responded:  0330

## 2016-06-06 LAB — TYPE AND SCREEN
ABO/RH(D): A NEG
ANTIBODY SCREEN: NEGATIVE
UNIT DIVISION: 0

## 2016-06-07 ENCOUNTER — Telehealth (HOSPITAL_COMMUNITY): Payer: Self-pay

## 2016-06-07 NOTE — Telephone Encounter (Signed)
Called to schedule 2 wk f/u, left message for pt to return call. AW 

## 2016-06-11 ENCOUNTER — Ambulatory Visit (HOSPITAL_COMMUNITY): Admission: RE | Admit: 2016-06-11 | Payer: BLUE CROSS/BLUE SHIELD | Source: Ambulatory Visit

## 2016-06-21 ENCOUNTER — Ambulatory Visit (HOSPITAL_COMMUNITY)
Admission: RE | Admit: 2016-06-21 | Discharge: 2016-06-21 | Disposition: A | Discharge status: Home / Self Care | Payer: BLUE CROSS/BLUE SHIELD | Source: Ambulatory Visit | Attending: Radiology | Admitting: Radiology

## 2016-06-21 DIAGNOSIS — Z48812 Encounter for surgical aftercare following surgery on the circulatory system: Secondary | ICD-10-CM | POA: Diagnosis not present

## 2016-06-21 DIAGNOSIS — I671 Cerebral aneurysm, nonruptured: Secondary | ICD-10-CM

## 2016-06-21 DIAGNOSIS — Z880 Allergy status to penicillin: Secondary | ICD-10-CM | POA: Diagnosis not present

## 2016-06-21 DIAGNOSIS — Z7982 Long term (current) use of aspirin: Secondary | ICD-10-CM | POA: Insufficient documentation

## 2016-06-21 HISTORY — PX: IR GENERIC HISTORICAL: IMG1180011

## 2016-06-21 LAB — CBC WITH DIFFERENTIAL/PLATELET
BASOS PCT: 0 %
Basophils Absolute: 0 10*3/uL (ref 0.0–0.1)
EOS ABS: 0.3 10*3/uL (ref 0.0–0.7)
EOS PCT: 6 %
HCT: 32.7 % — ABNORMAL LOW (ref 36.0–46.0)
Hemoglobin: 9.4 g/dL — ABNORMAL LOW (ref 12.0–15.0)
LYMPHS ABS: 1.6 10*3/uL (ref 0.7–4.0)
Lymphocytes Relative: 31 %
MCH: 21.1 pg — AB (ref 26.0–34.0)
MCHC: 28.7 g/dL — AB (ref 30.0–36.0)
MCV: 73.5 fL — AB (ref 78.0–100.0)
MONO ABS: 0.3 10*3/uL (ref 0.1–1.0)
Monocytes Relative: 5 %
NEUTROS PCT: 58 %
Neutro Abs: 3 10*3/uL (ref 1.7–7.7)
PLATELETS: 334 10*3/uL (ref 150–400)
RBC: 4.45 MIL/uL (ref 3.87–5.11)
RDW: 23.5 % — AB (ref 11.5–15.5)
WBC: 5.2 10*3/uL (ref 4.0–10.5)

## 2016-06-21 LAB — PLATELET INHIBITION P2Y12: Platelet Function  P2Y12: 100 [PRU] — ABNORMAL LOW (ref 194–418)

## 2016-06-22 LAB — VITAMIN D 25 HYDROXY (VIT D DEFICIENCY, FRACTURES): Vit D, 25-Hydroxy: 33.6 ng/mL (ref 30.0–100.0)

## 2016-06-27 ENCOUNTER — Encounter (HOSPITAL_COMMUNITY): Payer: Self-pay | Admitting: Interventional Radiology

## 2016-07-31 ENCOUNTER — Telehealth (HOSPITAL_COMMUNITY): Payer: Self-pay

## 2016-07-31 NOTE — Telephone Encounter (Signed)
Pt stated that she is having a colonoscopy on 08/08/16. She has to stop Brilinta on 08/04/16 per her Dr for procedure. Called to make sure this was ok with Deveshwar. Left Deveshwar a note and told pt that I would get back with her as soon as I here back from Dr with response. Pt agreed with this plan. AW

## 2016-08-06 ENCOUNTER — Telehealth (HOSPITAL_COMMUNITY): Payer: Self-pay | Admitting: Radiology

## 2016-08-06 NOTE — Telephone Encounter (Signed)
Called pt on both numbers, no answer and no VM

## 2016-08-21 ENCOUNTER — Other Ambulatory Visit: Payer: Self-pay | Admitting: Obstetrics & Gynecology

## 2016-09-06 ENCOUNTER — Telehealth (HOSPITAL_COMMUNITY): Payer: Self-pay

## 2016-09-06 NOTE — Telephone Encounter (Signed)
Called to schedule, left message for pt to call back. AW 

## 2016-09-11 ENCOUNTER — Other Ambulatory Visit (HOSPITAL_COMMUNITY): Payer: Self-pay | Admitting: Interventional Radiology

## 2016-09-11 DIAGNOSIS — I729 Aneurysm of unspecified site: Secondary | ICD-10-CM

## 2016-09-11 DIAGNOSIS — I671 Cerebral aneurysm, nonruptured: Secondary | ICD-10-CM

## 2016-09-20 ENCOUNTER — Encounter (HOSPITAL_COMMUNITY): Payer: Self-pay

## 2016-09-20 ENCOUNTER — Ambulatory Visit (HOSPITAL_COMMUNITY): Payer: BLUE CROSS/BLUE SHIELD

## 2016-09-20 ENCOUNTER — Ambulatory Visit (HOSPITAL_COMMUNITY)
Admission: RE | Admit: 2016-09-20 | Discharge: 2016-09-20 | Disposition: A | Discharge status: Home / Self Care | Payer: BLUE CROSS/BLUE SHIELD | Source: Ambulatory Visit | Attending: Interventional Radiology | Admitting: Interventional Radiology

## 2016-09-20 DIAGNOSIS — I729 Aneurysm of unspecified site: Secondary | ICD-10-CM

## 2016-09-20 DIAGNOSIS — I671 Cerebral aneurysm, nonruptured: Secondary | ICD-10-CM | POA: Diagnosis not present

## 2016-09-20 LAB — CREATININE, SERUM
Creatinine, Ser: 0.62 mg/dL (ref 0.44–1.00)
GFR calc Af Amer: >60 mL/min (ref >60)
GFR calc non Af Amer: >60 mL/min (ref >60)

## 2016-09-20 MED ORDER — GADOBENATE DIMEGLUMINE 529 MG/ML IV SOLN
15.0000 mL | Freq: Once | INTRAVENOUS | Status: AC | PRN
  Administered 2016-09-20: 15 mL via INTRAVENOUS

## 2016-09-28 ENCOUNTER — Telehealth (HOSPITAL_COMMUNITY): Payer: Self-pay

## 2016-09-28 NOTE — Telephone Encounter (Signed)
Pt agreed to f/u in 1 yr with MRI. She is no longer having any noises in her ear. She is currently taking brilinta and aspirin in the am and brilinta at bedtime. Pt wants to know if she should continue on Brilinta or can she stop. If she needs to continue then she needs a refill. Will ask Dr. Corliss Skainseveshwar and get back with her. Pt agreed with this plan. AW

## 2016-10-09 ENCOUNTER — Telehealth (HOSPITAL_COMMUNITY): Payer: Self-pay

## 2016-10-09 NOTE — Telephone Encounter (Signed)
Pt agreed to continue aspirin 81mg  and stop Brilinta per Dr. Corliss Skainseveshwar. AW

## 2016-10-16 ENCOUNTER — Telehealth (HOSPITAL_COMMUNITY): Payer: Self-pay

## 2016-10-16 NOTE — Telephone Encounter (Signed)
Pt left message that she was having bad headaches since she stopped Brilinta.  Returned call and pt stated that she is better. The headaches only lasted the first day after stopping medication. She will call if she has any issues. AW

## 2016-12-04 ENCOUNTER — Other Ambulatory Visit: Payer: Self-pay | Admitting: Obstetrics & Gynecology

## 2016-12-06 LAB — CYTOLOGY - PAP

## 2017-02-15 IMAGING — CT CT HEAD W/O CM
2 series · 16 of 30 positions shown, 20 images · non-contrast
Comparison: 11/24/2015

CLINICAL DATA: Stuttering for 2 days.  History of aneurysm

EXAM:
CT HEAD WITHOUT CONTRAST
TECHNIQUE: Contiguous axial images were obtained from the base of the skull
through the vertex without intravenous contrast.

[Series 201: head w/o, idose (1) · axial · non-contrast · 0.49mm/px · z∈[+109,+234]mm · 13 of 31 slices shown, 17 images]
[im 3/31  brain]
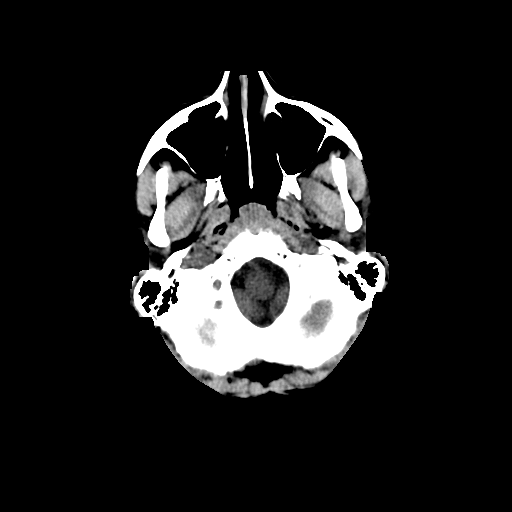
[im 3/31  bone]
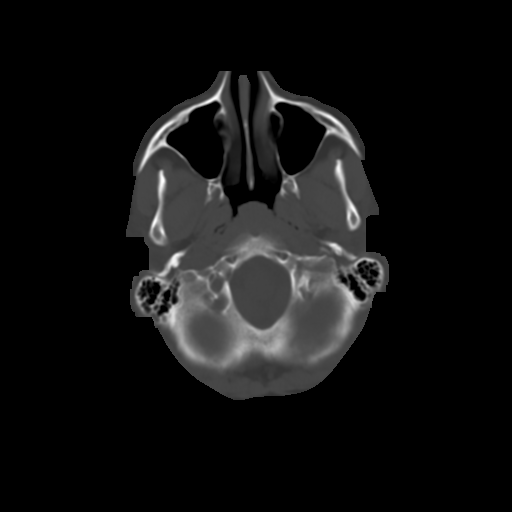
[im 5/31  brain]
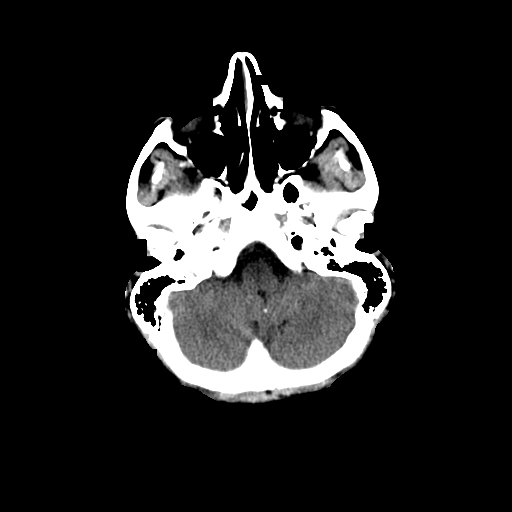
[im 7/31  brain]
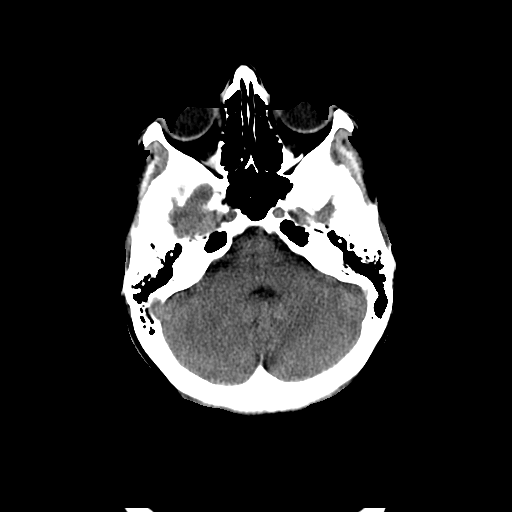
[im 9/31  brain]
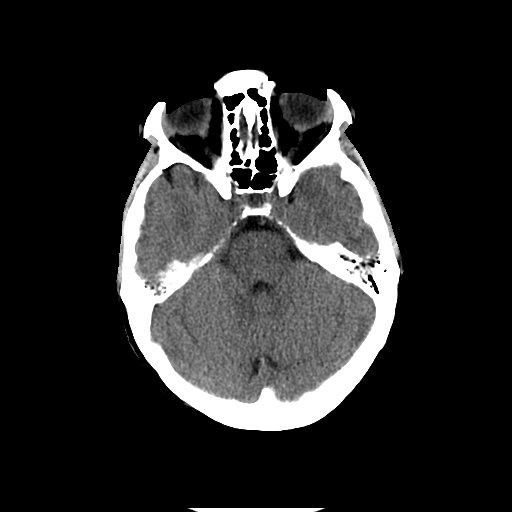
[im 11/31  brain]
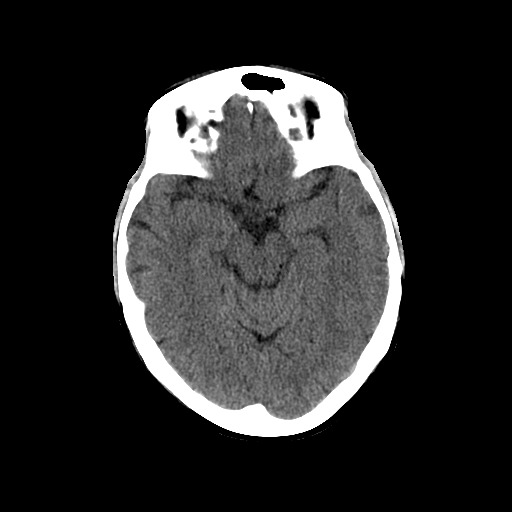
[im 11/31  bone]
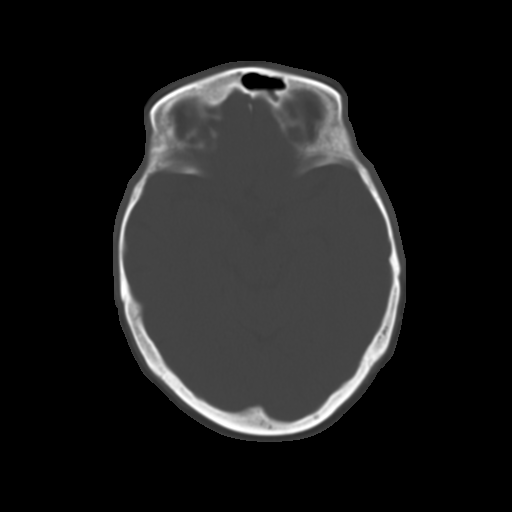
[im 13/31  brain]
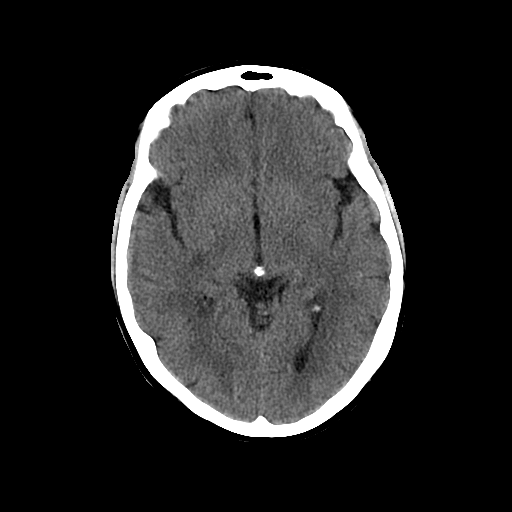
[im 16/31  brain]
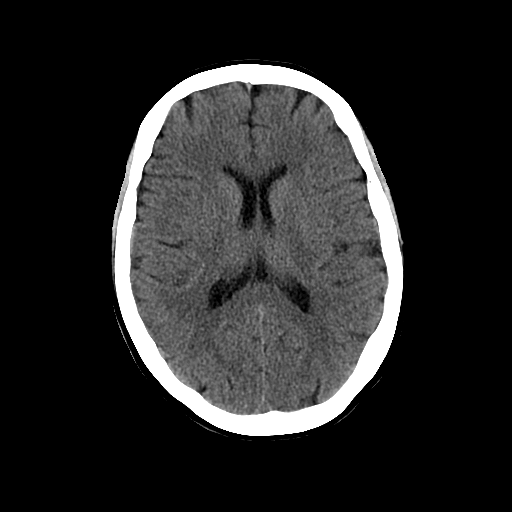
[im 18/31  brain]
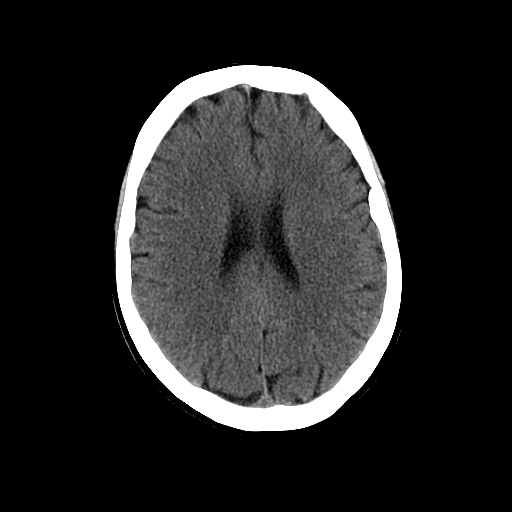
[im 20/31  brain]
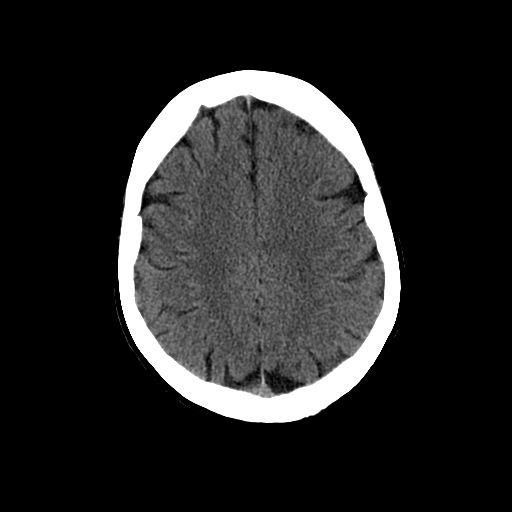
[im 20/31  bone]
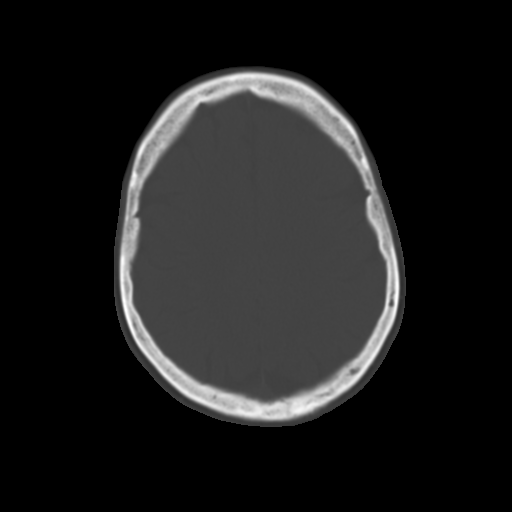
[im 22/31  brain]
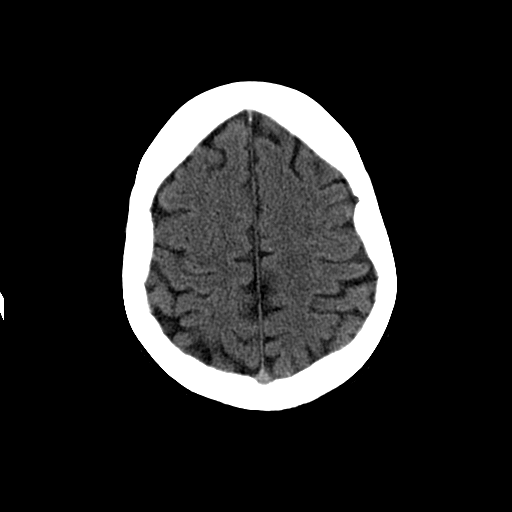
[im 24/31  brain]
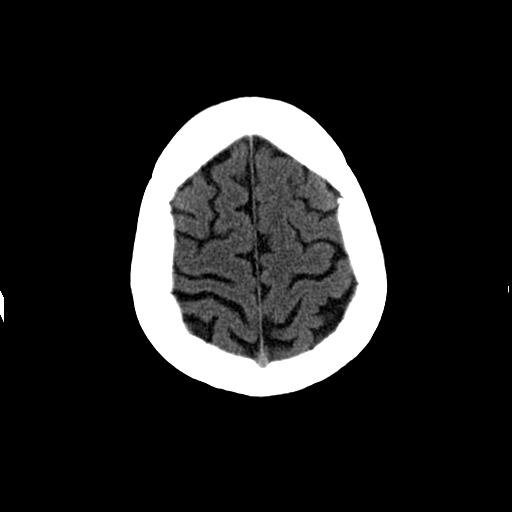
[im 26/31  brain]
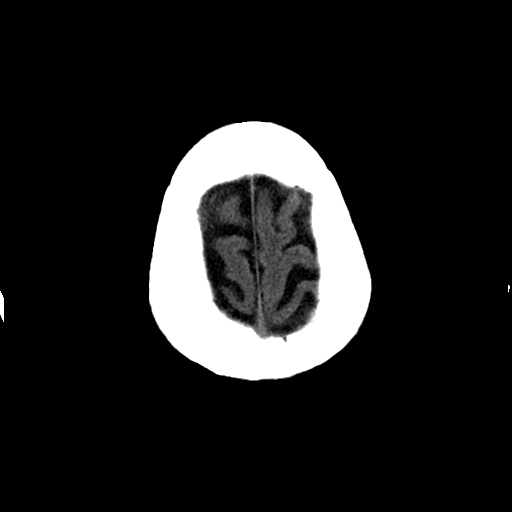
[im 28/31  brain]
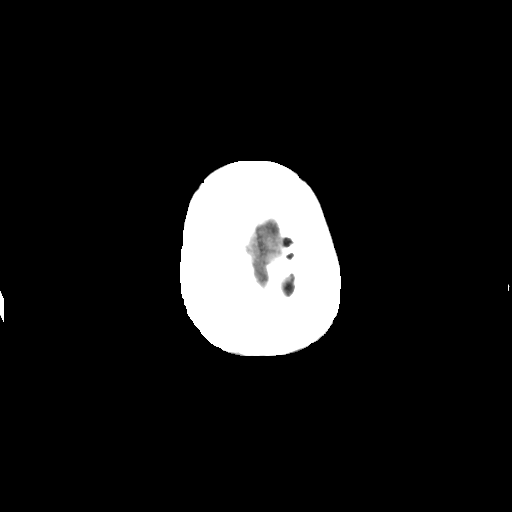
[im 28/31  bone]
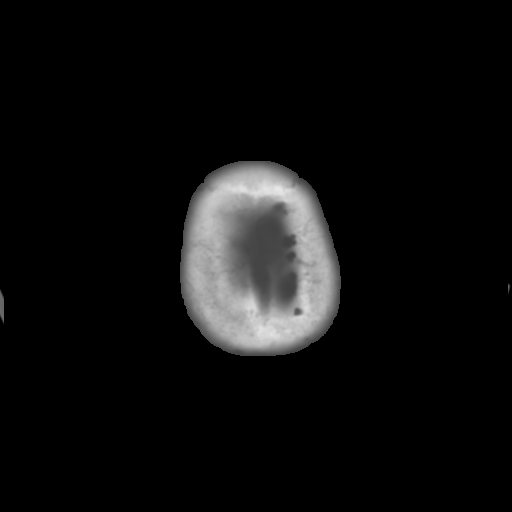

[Series 202: head w/o bone, idose (1) · axial · non-contrast · 0.49mm/px · z∈[+109,+149]mm · 3 of 31 slices shown]
[im 3/31  bone]
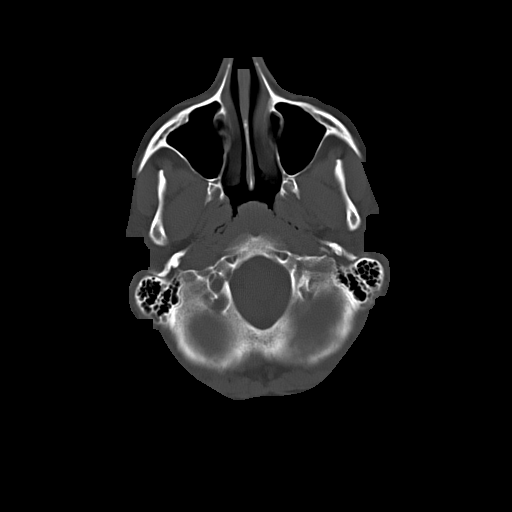
[im 7/31  bone]
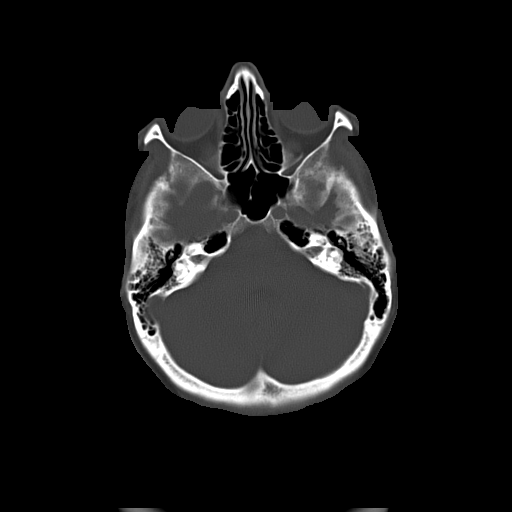
[im 11/31  bone]
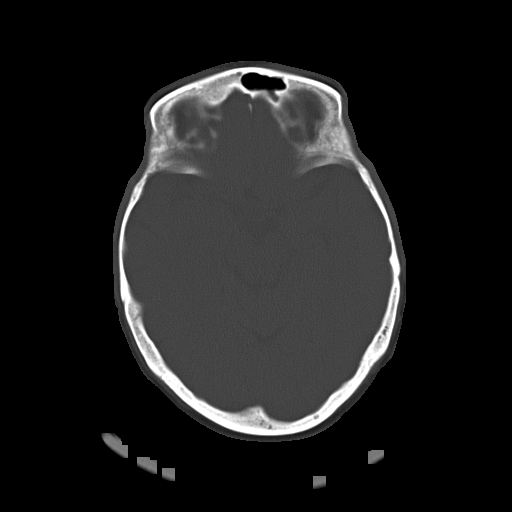

[16 of 30 positions shown; findings below may reference images not displayed]

FINDINGS: Skull and Sinuses:Negative for fracture or destructive process.

Chronic adenoid thickening.  Clear visualized sinuses and mastoids.

Visualized orbits: Negative.

Brain: No evidence of acute infarction, hemorrhage, hydrocephalus,
or mass lesion/mass effect. Low-density posterior and inferior to
the right putamen is a dilated perivascular space based on 8324
brain MRI. Left frontal lobe parasagittal gliosis on prior brain MRI
is subtle on this scan.
IMPRESSION: No acute finding or change from 8324.

## 2017-05-08 IMAGING — XA IR TRANSCATH EMBOLIZATION
1 series · 9 of 24 positions shown · IV contrast (IODINE)
Comparison: none

INDICATION: Left-sided pulsatile tinnitus. History of left-sided dural AV
fistula involving the external carotid artery branches and the
ipsilateral sigmoid sinus.

[Series 300: dr. (person_name) · 9 of 290 slices shown]
[im 13/290]
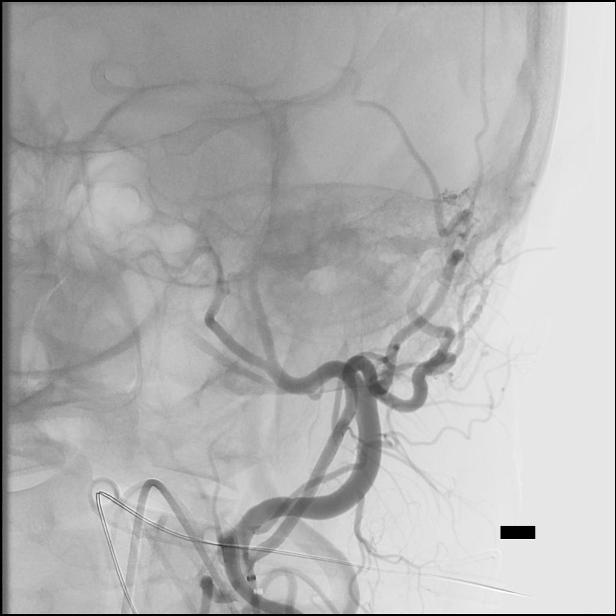
[im 51/290]
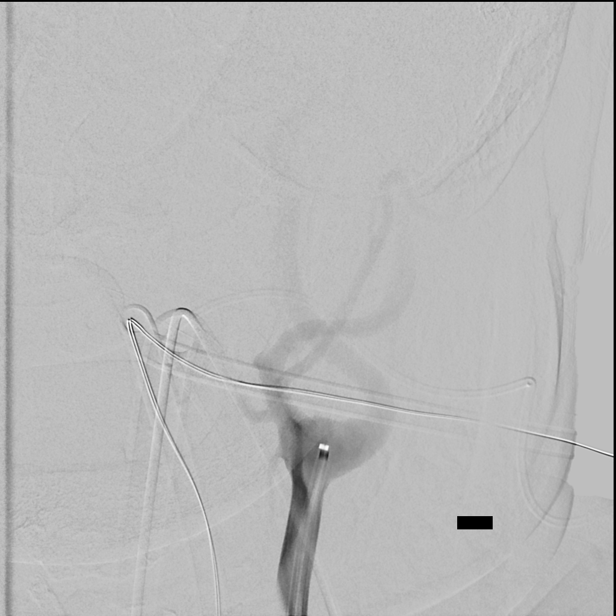
[im 76/290]
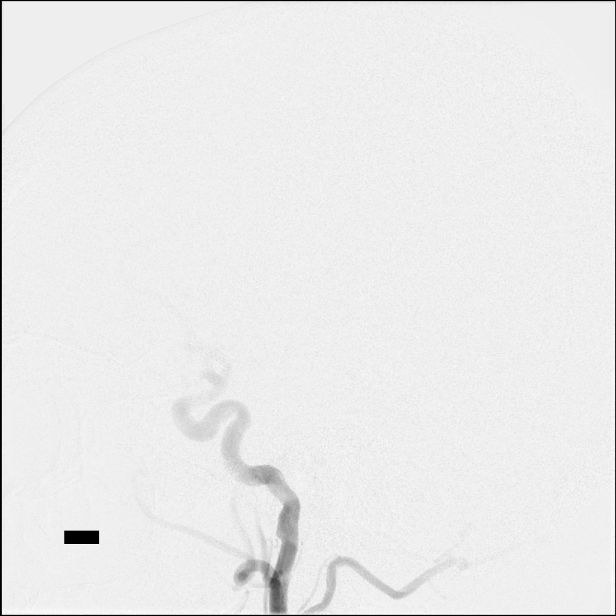
[im 114/290]
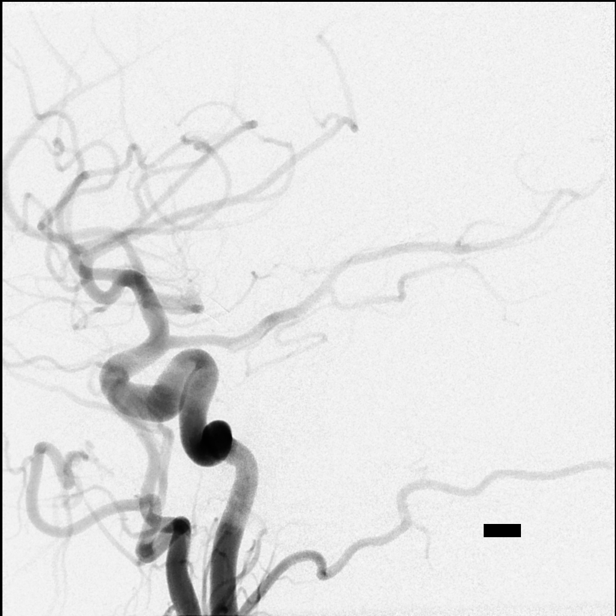
[im 151/290]
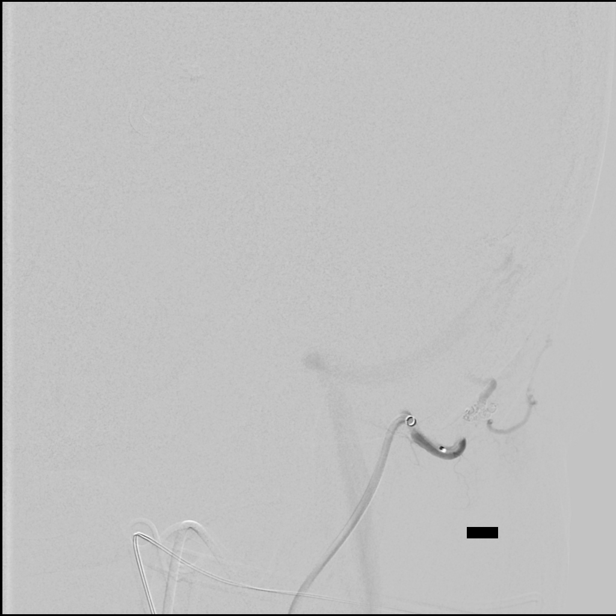
[im 176/290]
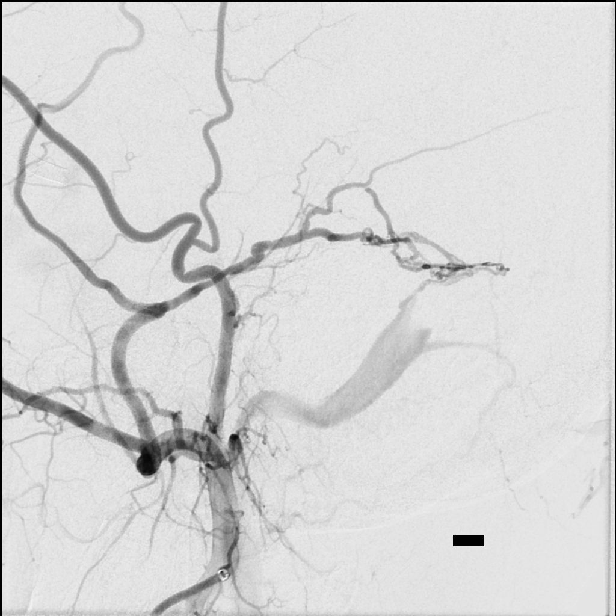
[im 214/290]
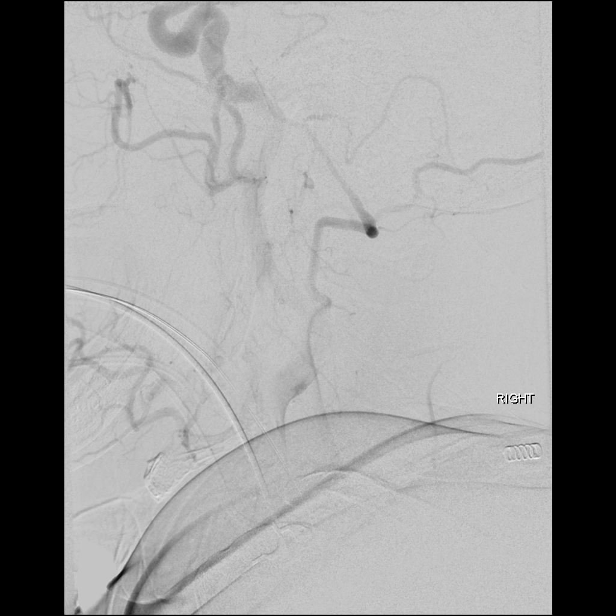
[im 252/290]
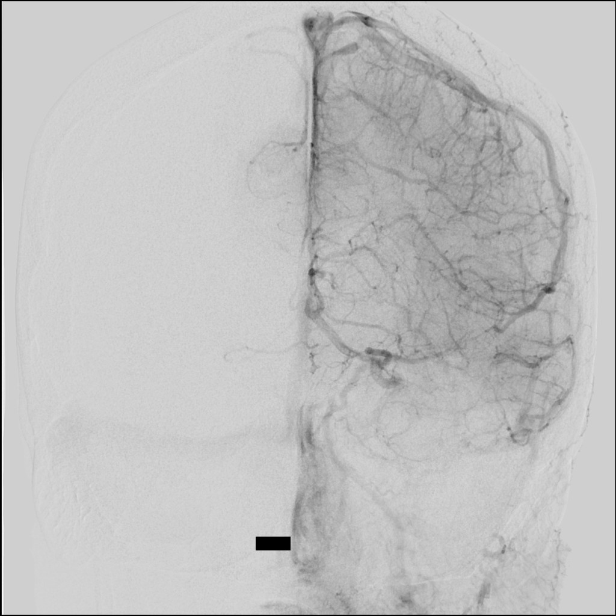
[im 277/290]
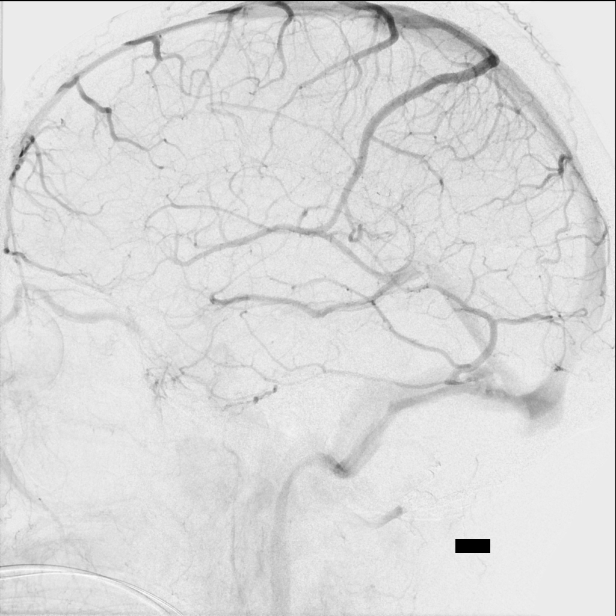

[9 of 24 positions shown; findings below may reference images not displayed]

EXAM:
TRANSCATHETER THERAPY EMBOLIZATION; BILATERAL COMMON CAROTID AND
INNOMINATE ANGIOGRAPHY; ARTERIOGRAPHY; IR ANGIO EXTERNAL CAROTID SEL
EXT CAROTID UNI LEFT MOD SED; IR NEURO EACH ADD'L AFTER BASIC UNI
LEFT (MS)

MEDICATIONS:
1 g vancomycin IV. The antibiotic was administered within 1 hour of
the procedure.

ANESTHESIA/SEDATION:
General anesthesia.

CONTRAST:  Isovue 300 approximately 60 mL.

FLUOROSCOPY TIME:  Fluoroscopy Time: 44 minutes 24 seconds (5777
mGy).

COMPLICATIONS:
None immediate.



Following a full explanation of the procedure along with the
potential associated complications, an informed witnessed consent
was obtained.

The patient was put under general anesthesia by the [REDACTED] at [HOSPITAL].

The right groin was prepped and draped in the usual sterile fashion.
Thereafter using modified Seldinger technique, transfemoral access
into right common femoral artery was obtained without difficulty.
Over a 0.035 inch guidewire, a 5 French Pinnacle sheath was
inserted. Through this, and also over a 0.035 inch guidewire a 5
French JB 1 catheter was advanced to the aortic arch region and
selectively positioned in the right common carotid artery and the
left common carotid artery.

The patient tolerated the procedure well.
FINDINGS: FINDINGS
The right common carotid arteriogram demonstrates the right external
carotid artery and its major branches to be widely patent.

The right internal carotid artery at the bulb to the cranial skull
base also opacifies normally.

The petrous, the cavernous and the supraclinoid segments are widely
patent.

There is mild fusiform dilatation of the proximal cavernous segment.
A right posterior communicating artery is seen opacifying the right
posterior cerebral artery distribution.

The right middle cerebral artery and the right anterior cerebral
artery are seen to opacify normally into the capillary and the
venous phases.

The left common carotid arteriogram demonstrates the left external
carotid artery and its major branches to be widely patent.

The left internal carotid artery at the bulb to the cranial skull
base opacifies normally.

The petrous and the cavernous segments are widely patent.

There is mild fusiform dilatation of the proximal cavernous segment.

The distal cavernous and the supraclinoid segments are widely
patent.

The left middle cerebral artery and the left anterior cerebral
artery opacify normally into the capillary and the venous phases.

Wide patency is seen of the previously flow diverter device
extending from the left A1 segment into the left A2 segment. There
is complete obliteration of the previously noted dysplastic related
anterior communicating artery region aneurysm.

The left external carotid artery origin is normal.

Its branches are seen to opacify with wide patency.

There is abnormal prominence of the left middle meningeal artery
posterior branch, and also the left occipital artery distal
occipital region which give rise to numerous small irregular
communications which then drain into the left sigmoid sinus.

There is a significantly decreased caliber of the left transverse
sinus proximal to left transverse sinus/sigmoid sinus junction.

ENDOVASCULAR OBLITERATION OF THE LEFT SIDED DURAL AV FISTULA
SUPPLYING THE LEFT EXTERNAL CAROTID ARTERY BRANCHES DRAINING INTO
THE LEFT SIGMOID SINUS:

The diagnostic JB 1 catheter in the left common carotid artery was
then exchanged over 0.035 inch 300 cm Rosen exchange guidewire for a
6 French 80 cm Infinity sheath using biplane roadmap technique and
constant fluoroscopic guidance. Good aspiration was obtained from
the hub of the Infinity neurovascular sheath. A gentle contrast
injection demonstrated no evidence of spasms, dissections or of
intraluminal filling defects.

This was then connected to continuous heparinized saline infusion.

Over the Rosen exchange guidewire, a 115 cm 5 Skinny Niemann guide
catheter was then advanced to the distal left external carotid
artery proximal to the internal maxillary artery and the superficial
temporal arteries.

Using biplane roadmap technique and constant fluoroscopic guidance
over a 0.035 inch Roadrunner guidewire, the guidewire was removed.
Good aspiration was obtained from the hub of the 5 Skinny Niemann
guide catheter.

A control arteriogram performed through the guide catheter
demonstrated no change in the extracranial circulation. Again
demonstrated was the abnormally prominent posterior parietal branch
of the left middle meningeal artery feeding into the dural fistula.

At this time, in a coaxial manner and with constant heparinized
saline infusion using biplane roadmap technique and constant
fluoroscopic guidance, an Locklear flow directed microcatheter was
then advanced over a 0.018 inch Transend EX micro guidewire to the
distal aspect of the posterior branch of the left middle meningeal
artery. The advancing wire had a J-tip configuration to avoid
dissections or inducing spasm.

The guidewire was removed. Good aspiration was obtained from the hub
of the Locklear microcatheter. A gentle contrast injection
demonstrated brisk antegrade flow feeding into the multiple numerous
branches feeding into the left sigmoid sinus.

At this time, the dead space in the microcatheter was then filled
with 0.23 mL of DMSO, followed by the slow injection of Onyx 18
using the thumb tapping technique.

This was continued until such time there was significant penetration
into the multiple branches from the middle meningeal branch.

Following this, a control arteriogram performed through the 5 Nard
Aphnaan guide catheter in the left external carotid artery
demonstrated complete obliteration of the numerous branches from the
middle meningeal branch.

At this time, with constant gentle aspiration at the hub of the
microcatheter, under constant fluoroscopic guidance, the
microcatheter was gently retrieved from the Onyx cast without any
movement.

The 5 Skinny Niemann guide catheter was then retrieved proximally to
the origin of the left occipital artery. Control arteriogram
performed at this site revealed multiple branches feeding the
inferior/posterior aspect of the dural fistula.

The 5 Skinny Niemann guide catheter was then advanced using biplane
roadmap technique and constant fluoroscopic guidance over a
inch Roadrunner guidewire proximal to the main arterial branches
feeding the dural fistula.

The guidewire was removed. Good aspiration was obtained from the hub
of the 5 Skinny Niemann guide catheter.

At this time, a Scepter XC 4 x 10 mm compliant balloon was prepped
and purged of air in the usual fashion.

It was then aspirated while being held immersed in heparin normal
saline.

Over a 0.018 inch Transend EX micro guidewire, the Scepter XC
balloon catheter was then advanced and positioned at the main
arterial branches supplying the dural fistula from the occipital
artery. The guidewire was removed. Good aspiration was obtained from
the hub of the Scepter XC microcatheter.

At this time, Axium EV 3, 4 mm x 6 cm, and then a 3 mm x 4 cm
complex coils were then advanced and delivered just distal to the
main arterial branches.

For proximal flow arrest and prior to the injection of the Onyx
embolic material a control arteriogram was performed following the
delivery of the coils and demonstrates significantly slow flow into
the distal left occipital artery with most of the blood draining
into the multiple branches into the dural fistula.

At this site, the fistulous communications were embolized with Onyx
18 again using the thumb tapping technique after having cleared the
dead space with 0.33 cc of DMSO.

The technique was utilized until there was resistance to penetration
distally.

Following that, the balloon was deflated in the occipital artery,
and with constant aspiration, this was retrieved with no motion of
the embolic cast noted.

A control arteriogram performed through the 5 Skinny Niemann guide
catheter demonstrated complete obliteration of the numerous branches
from the left occipital artery.

The Aphnaan catheter was then retrieved into just inside the origin of
the left external carotid artery. An arteriogram performed at this
site demonstrated almost complete obliteration of the dural fistula
with hair thin clinically delayed opacification of the left sigmoid
sinus

The 5 French guide catheter was retrieved and removed. The 6 French
guide sheath was retrieved into the left common carotid artery. A
control arteriogram perfor[REDACTED]ed intracranially demonstrated
no change in the intracranial circulation. No occlusion, stenosis,
dissections or filling defects are noted.

Throughout the procedure the patient's ACT was maintained in the
region of approximately 180 seconds.

Following injection of DMSO, and the Onyx, there was intermittent
erratic hypertension associated with tachycardia which were treated
appropriately.

The patient's general anesthesia was reversed and the patient was
then extubated. Upon recovery, the patient demonstrated no new
neurological signs or symptoms. She was then transferred to the
neuro ICU for overnight observations.

She was able to eat a normal meal towards the end of the evening.
Her overnight stay was unremarkable. The following day, the
patient's right groin appeared soft. Distal pulses were present.
Neurologically she was intact

However her hemoglobin was 6.9 which prompted a transfusion of one
unit of red blood cells without any complications. The patient was
then discharged to return to the clinic in two weeks time. Questions
were answered to her satisfaction. She was asked to call should she
have any concerns or questions. Incidentally, she was also informed
of the complete obliteration of the previously treated anterior
communicating artery region aneurysm.
IMPRESSION: Status post endovascular treatment of left-sided dural AV fistula
fed by the external carotid branches as described above with near
complete obliteration using endovascular coils, and Onyx 18 liquid
embolic material.

## 2017-11-13 DIAGNOSIS — D649 Anemia, unspecified: Secondary | ICD-10-CM | POA: Diagnosis not present

## 2017-11-13 DIAGNOSIS — E119 Type 2 diabetes mellitus without complications: Secondary | ICD-10-CM | POA: Diagnosis not present

## 2017-11-13 DIAGNOSIS — Z1389 Encounter for screening for other disorder: Secondary | ICD-10-CM | POA: Diagnosis not present

## 2017-11-13 DIAGNOSIS — Z0000 Encounter for general adult medical examination without abnormal findings: Secondary | ICD-10-CM | POA: Diagnosis not present

## 2017-11-13 DIAGNOSIS — F411 Generalized anxiety disorder: Secondary | ICD-10-CM | POA: Diagnosis not present

## 2017-12-18 ENCOUNTER — Other Ambulatory Visit (HOSPITAL_COMMUNITY): Payer: Self-pay | Admitting: Interventional Radiology

## 2017-12-18 DIAGNOSIS — I729 Aneurysm of unspecified site: Secondary | ICD-10-CM

## 2017-12-18 DIAGNOSIS — I671 Cerebral aneurysm, nonruptured: Secondary | ICD-10-CM

## 2017-12-25 DIAGNOSIS — E119 Type 2 diabetes mellitus without complications: Secondary | ICD-10-CM | POA: Diagnosis not present

## 2018-01-07 ENCOUNTER — Ambulatory Visit (HOSPITAL_COMMUNITY): Payer: BLUE CROSS/BLUE SHIELD

## 2018-01-07 ENCOUNTER — Ambulatory Visit (HOSPITAL_COMMUNITY)
Admission: RE | Admit: 2018-01-07 | Discharge: 2018-01-07 | Disposition: A | Discharge status: Home / Self Care | Payer: BLUE CROSS/BLUE SHIELD | Source: Ambulatory Visit | Attending: Interventional Radiology | Admitting: Interventional Radiology

## 2018-01-07 DIAGNOSIS — Z6828 Body mass index (BMI) 28.0-28.9, adult: Secondary | ICD-10-CM | POA: Diagnosis not present

## 2018-01-07 DIAGNOSIS — Z95828 Presence of other vascular implants and grafts: Secondary | ICD-10-CM | POA: Insufficient documentation

## 2018-01-07 DIAGNOSIS — G9389 Other specified disorders of brain: Secondary | ICD-10-CM | POA: Diagnosis not present

## 2018-01-07 DIAGNOSIS — I671 Cerebral aneurysm, nonruptured: Secondary | ICD-10-CM | POA: Diagnosis not present

## 2018-01-07 DIAGNOSIS — I729 Aneurysm of unspecified site: Secondary | ICD-10-CM | POA: Diagnosis not present

## 2018-01-07 DIAGNOSIS — Z01419 Encounter for gynecological examination (general) (routine) without abnormal findings: Secondary | ICD-10-CM | POA: Diagnosis not present

## 2018-01-07 DIAGNOSIS — I770 Arteriovenous fistula, acquired: Secondary | ICD-10-CM | POA: Diagnosis not present

## 2018-01-07 DIAGNOSIS — Z1231 Encounter for screening mammogram for malignant neoplasm of breast: Secondary | ICD-10-CM | POA: Diagnosis not present

## 2018-01-07 LAB — POCT I-STAT CREATININE: Creatinine, Ser: 0.5 mg/dL (ref 0.44–1.00)

## 2018-01-07 MED ORDER — GADOBENATE DIMEGLUMINE 529 MG/ML IV SOLN
15.0000 mL | Freq: Once | INTRAVENOUS | Status: AC | PRN
  Administered 2018-01-07: 15 mL via INTRAVENOUS

## 2018-06-05 NOTE — Telephone Encounter (Signed)
Signing past encounter of attempted phone call 

## 2018-09-22 DIAGNOSIS — R32 Unspecified urinary incontinence: Secondary | ICD-10-CM | POA: Diagnosis not present

## 2018-09-22 DIAGNOSIS — R5383 Other fatigue: Secondary | ICD-10-CM | POA: Diagnosis not present

## 2018-09-22 DIAGNOSIS — E1165 Type 2 diabetes mellitus with hyperglycemia: Secondary | ICD-10-CM | POA: Diagnosis not present

## 2019-01-07 DIAGNOSIS — I671 Cerebral aneurysm, nonruptured: Secondary | ICD-10-CM | POA: Diagnosis not present

## 2019-01-07 DIAGNOSIS — E1140 Type 2 diabetes mellitus with diabetic neuropathy, unspecified: Secondary | ICD-10-CM | POA: Diagnosis not present

## 2019-01-07 DIAGNOSIS — R350 Frequency of micturition: Secondary | ICD-10-CM | POA: Diagnosis not present

## 2019-01-07 DIAGNOSIS — Z0000 Encounter for general adult medical examination without abnormal findings: Secondary | ICD-10-CM | POA: Diagnosis not present

## 2019-01-07 DIAGNOSIS — E1165 Type 2 diabetes mellitus with hyperglycemia: Secondary | ICD-10-CM | POA: Diagnosis not present

## 2019-01-07 DIAGNOSIS — Z1331 Encounter for screening for depression: Secondary | ICD-10-CM | POA: Diagnosis not present

## 2019-01-20 DIAGNOSIS — Z6829 Body mass index (BMI) 29.0-29.9, adult: Secondary | ICD-10-CM | POA: Diagnosis not present

## 2019-01-20 DIAGNOSIS — Z1231 Encounter for screening mammogram for malignant neoplasm of breast: Secondary | ICD-10-CM | POA: Diagnosis not present

## 2019-01-20 DIAGNOSIS — Z01419 Encounter for gynecological examination (general) (routine) without abnormal findings: Secondary | ICD-10-CM | POA: Diagnosis not present

## 2019-01-20 DIAGNOSIS — Z124 Encounter for screening for malignant neoplasm of cervix: Secondary | ICD-10-CM | POA: Diagnosis not present

## 2019-04-08 DIAGNOSIS — E1165 Type 2 diabetes mellitus with hyperglycemia: Secondary | ICD-10-CM | POA: Diagnosis not present

## 2019-06-09 DIAGNOSIS — E119 Type 2 diabetes mellitus without complications: Secondary | ICD-10-CM | POA: Diagnosis not present

## 2019-07-10 DIAGNOSIS — E119 Type 2 diabetes mellitus without complications: Secondary | ICD-10-CM | POA: Diagnosis not present

## 2019-10-09 DIAGNOSIS — E1140 Type 2 diabetes mellitus with diabetic neuropathy, unspecified: Secondary | ICD-10-CM | POA: Diagnosis not present

## 2020-01-14 DIAGNOSIS — I10 Essential (primary) hypertension: Secondary | ICD-10-CM | POA: Diagnosis not present

## 2020-01-14 DIAGNOSIS — E119 Type 2 diabetes mellitus without complications: Secondary | ICD-10-CM | POA: Diagnosis not present

## 2020-01-14 DIAGNOSIS — Z1331 Encounter for screening for depression: Secondary | ICD-10-CM | POA: Diagnosis not present

## 2020-01-14 DIAGNOSIS — E1140 Type 2 diabetes mellitus with diabetic neuropathy, unspecified: Secondary | ICD-10-CM | POA: Diagnosis not present

## 2020-01-14 DIAGNOSIS — Z13220 Encounter for screening for lipoid disorders: Secondary | ICD-10-CM | POA: Diagnosis not present

## 2020-01-14 DIAGNOSIS — F411 Generalized anxiety disorder: Secondary | ICD-10-CM | POA: Diagnosis not present

## 2020-01-14 DIAGNOSIS — Z0000 Encounter for general adult medical examination without abnormal findings: Secondary | ICD-10-CM | POA: Diagnosis not present

## 2020-01-15 DIAGNOSIS — D649 Anemia, unspecified: Secondary | ICD-10-CM | POA: Diagnosis not present

## 2020-01-15 DIAGNOSIS — R309 Painful micturition, unspecified: Secondary | ICD-10-CM | POA: Diagnosis not present

## 2020-02-02 DIAGNOSIS — Z01419 Encounter for gynecological examination (general) (routine) without abnormal findings: Secondary | ICD-10-CM | POA: Diagnosis not present

## 2020-02-02 DIAGNOSIS — Z1231 Encounter for screening mammogram for malignant neoplasm of breast: Secondary | ICD-10-CM | POA: Diagnosis not present

## 2020-02-02 DIAGNOSIS — Z6827 Body mass index (BMI) 27.0-27.9, adult: Secondary | ICD-10-CM | POA: Diagnosis not present

## 2020-02-03 DIAGNOSIS — D500 Iron deficiency anemia secondary to blood loss (chronic): Secondary | ICD-10-CM | POA: Diagnosis not present

## 2020-03-30 DIAGNOSIS — M25571 Pain in right ankle and joints of right foot: Secondary | ICD-10-CM | POA: Diagnosis not present

## 2020-04-13 DIAGNOSIS — D509 Iron deficiency anemia, unspecified: Secondary | ICD-10-CM | POA: Diagnosis not present

## 2020-04-13 DIAGNOSIS — E1140 Type 2 diabetes mellitus with diabetic neuropathy, unspecified: Secondary | ICD-10-CM | POA: Diagnosis not present

## 2020-04-13 DIAGNOSIS — E119 Type 2 diabetes mellitus without complications: Secondary | ICD-10-CM | POA: Diagnosis not present

## 2020-12-06 DIAGNOSIS — E119 Type 2 diabetes mellitus without complications: Secondary | ICD-10-CM | POA: Diagnosis not present

## 2021-01-19 DIAGNOSIS — F411 Generalized anxiety disorder: Secondary | ICD-10-CM | POA: Diagnosis not present

## 2021-01-19 DIAGNOSIS — Z1331 Encounter for screening for depression: Secondary | ICD-10-CM | POA: Diagnosis not present

## 2021-01-19 DIAGNOSIS — D509 Iron deficiency anemia, unspecified: Secondary | ICD-10-CM | POA: Diagnosis not present

## 2021-01-19 DIAGNOSIS — Z0000 Encounter for general adult medical examination without abnormal findings: Secondary | ICD-10-CM | POA: Diagnosis not present

## 2021-01-19 DIAGNOSIS — E1140 Type 2 diabetes mellitus with diabetic neuropathy, unspecified: Secondary | ICD-10-CM | POA: Diagnosis not present

## 2021-01-19 DIAGNOSIS — Z1339 Encounter for screening examination for other mental health and behavioral disorders: Secondary | ICD-10-CM | POA: Diagnosis not present

## 2021-01-19 DIAGNOSIS — I671 Cerebral aneurysm, nonruptured: Secondary | ICD-10-CM | POA: Diagnosis not present

## 2021-01-19 DIAGNOSIS — Z6828 Body mass index (BMI) 28.0-28.9, adult: Secondary | ICD-10-CM | POA: Diagnosis not present

## 2021-02-13 DIAGNOSIS — Z124 Encounter for screening for malignant neoplasm of cervix: Secondary | ICD-10-CM | POA: Diagnosis not present

## 2021-02-13 DIAGNOSIS — Z01419 Encounter for gynecological examination (general) (routine) without abnormal findings: Secondary | ICD-10-CM | POA: Diagnosis not present

## 2021-02-13 DIAGNOSIS — Z1231 Encounter for screening mammogram for malignant neoplasm of breast: Secondary | ICD-10-CM | POA: Diagnosis not present

## 2021-02-13 DIAGNOSIS — Z6828 Body mass index (BMI) 28.0-28.9, adult: Secondary | ICD-10-CM | POA: Diagnosis not present

## 2021-04-13 DIAGNOSIS — E1140 Type 2 diabetes mellitus with diabetic neuropathy, unspecified: Secondary | ICD-10-CM | POA: Diagnosis not present

## 2021-04-21 DIAGNOSIS — E1140 Type 2 diabetes mellitus with diabetic neuropathy, unspecified: Secondary | ICD-10-CM | POA: Diagnosis not present

## 2021-12-07 DIAGNOSIS — E119 Type 2 diabetes mellitus without complications: Secondary | ICD-10-CM | POA: Diagnosis not present

## 2021-12-08 DIAGNOSIS — M79662 Pain in left lower leg: Secondary | ICD-10-CM | POA: Diagnosis not present

## 2021-12-08 DIAGNOSIS — M79661 Pain in right lower leg: Secondary | ICD-10-CM | POA: Diagnosis not present

## 2021-12-08 DIAGNOSIS — E1140 Type 2 diabetes mellitus with diabetic neuropathy, unspecified: Secondary | ICD-10-CM | POA: Diagnosis not present

## 2021-12-13 DIAGNOSIS — M79661 Pain in right lower leg: Secondary | ICD-10-CM | POA: Diagnosis not present

## 2021-12-13 DIAGNOSIS — M79662 Pain in left lower leg: Secondary | ICD-10-CM | POA: Diagnosis not present

## 2022-01-01 DIAGNOSIS — M79604 Pain in right leg: Secondary | ICD-10-CM | POA: Diagnosis not present

## 2022-01-01 DIAGNOSIS — M79605 Pain in left leg: Secondary | ICD-10-CM | POA: Diagnosis not present

## 2022-02-22 DIAGNOSIS — Z6826 Body mass index (BMI) 26.0-26.9, adult: Secondary | ICD-10-CM | POA: Diagnosis not present

## 2022-02-22 DIAGNOSIS — Z1231 Encounter for screening mammogram for malignant neoplasm of breast: Secondary | ICD-10-CM | POA: Diagnosis not present

## 2022-02-22 DIAGNOSIS — Z01419 Encounter for gynecological examination (general) (routine) without abnormal findings: Secondary | ICD-10-CM | POA: Diagnosis not present

## 2022-03-13 DIAGNOSIS — E1140 Type 2 diabetes mellitus with diabetic neuropathy, unspecified: Secondary | ICD-10-CM | POA: Diagnosis not present

## 2022-07-03 DIAGNOSIS — F411 Generalized anxiety disorder: Secondary | ICD-10-CM | POA: Diagnosis not present

## 2022-07-03 DIAGNOSIS — E1140 Type 2 diabetes mellitus with diabetic neuropathy, unspecified: Secondary | ICD-10-CM | POA: Diagnosis not present

## 2022-07-03 DIAGNOSIS — Z0000 Encounter for general adult medical examination without abnormal findings: Secondary | ICD-10-CM | POA: Diagnosis not present

## 2022-07-03 DIAGNOSIS — Z1331 Encounter for screening for depression: Secondary | ICD-10-CM | POA: Diagnosis not present

## 2022-07-03 DIAGNOSIS — I671 Cerebral aneurysm, nonruptured: Secondary | ICD-10-CM | POA: Diagnosis not present

## 2022-07-03 DIAGNOSIS — Z6826 Body mass index (BMI) 26.0-26.9, adult: Secondary | ICD-10-CM | POA: Diagnosis not present

## 2022-08-16 DIAGNOSIS — R1084 Generalized abdominal pain: Secondary | ICD-10-CM | POA: Diagnosis not present

## 2022-08-16 DIAGNOSIS — K3184 Gastroparesis: Secondary | ICD-10-CM | POA: Diagnosis not present

## 2022-08-16 DIAGNOSIS — R110 Nausea: Secondary | ICD-10-CM | POA: Diagnosis not present

## 2022-08-20 DIAGNOSIS — R112 Nausea with vomiting, unspecified: Secondary | ICD-10-CM | POA: Diagnosis not present

## 2022-08-20 DIAGNOSIS — R109 Unspecified abdominal pain: Secondary | ICD-10-CM | POA: Diagnosis not present

## 2022-08-29 DIAGNOSIS — R109 Unspecified abdominal pain: Secondary | ICD-10-CM | POA: Diagnosis not present

## 2022-08-30 DIAGNOSIS — R110 Nausea: Secondary | ICD-10-CM | POA: Diagnosis not present

## 2022-08-30 DIAGNOSIS — R1013 Epigastric pain: Secondary | ICD-10-CM | POA: Diagnosis not present

## 2022-08-30 DIAGNOSIS — R198 Other specified symptoms and signs involving the digestive system and abdomen: Secondary | ICD-10-CM | POA: Diagnosis not present

## 2022-08-30 DIAGNOSIS — Z1212 Encounter for screening for malignant neoplasm of rectum: Secondary | ICD-10-CM | POA: Diagnosis not present

## 2022-09-03 DIAGNOSIS — K824 Cholesterolosis of gallbladder: Secondary | ICD-10-CM | POA: Diagnosis not present

## 2022-09-03 DIAGNOSIS — K7689 Other specified diseases of liver: Secondary | ICD-10-CM | POA: Diagnosis not present

## 2022-09-03 DIAGNOSIS — R1013 Epigastric pain: Secondary | ICD-10-CM | POA: Diagnosis not present

## 2022-09-19 DIAGNOSIS — R1013 Epigastric pain: Secondary | ICD-10-CM | POA: Diagnosis not present

## 2022-09-19 DIAGNOSIS — R198 Other specified symptoms and signs involving the digestive system and abdomen: Secondary | ICD-10-CM | POA: Diagnosis not present

## 2022-09-25 DIAGNOSIS — E1140 Type 2 diabetes mellitus with diabetic neuropathy, unspecified: Secondary | ICD-10-CM | POA: Diagnosis not present

## 2022-09-25 DIAGNOSIS — F411 Generalized anxiety disorder: Secondary | ICD-10-CM | POA: Diagnosis not present

## 2022-12-03 ENCOUNTER — Other Ambulatory Visit: Payer: Self-pay

## 2022-12-03 MED ORDER — MOUNJARO 2.5 MG/0.5ML ~~LOC~~ SOAJ: 2.5000 mg | SUBCUTANEOUS | 1 refills | Status: DC

## 2022-12-03 MED ORDER — BUPROPION HCL ER (XL) 150 MG PO TB24: 150.0000 mg | ORAL_TABLET | Freq: Every day | ORAL | 1 refills | Status: DC

## 2022-12-07 ENCOUNTER — Encounter: Payer: Self-pay | Admitting: Internal Medicine

## 2022-12-07 ENCOUNTER — Ambulatory Visit: Payer: BC Managed Care – PPO | Admitting: Internal Medicine

## 2022-12-07 VITALS — BP 120/70 | HR 90 | Temp 98.0°F | Resp 16 | Ht 63.0 in | Wt 165.8 lb

## 2022-12-07 DIAGNOSIS — E1142 Type 2 diabetes mellitus with diabetic polyneuropathy: Secondary | ICD-10-CM

## 2022-12-07 DIAGNOSIS — G4700 Insomnia, unspecified: Secondary | ICD-10-CM

## 2022-12-07 DIAGNOSIS — F411 Generalized anxiety disorder: Secondary | ICD-10-CM | POA: Diagnosis not present

## 2022-12-07 MED ORDER — GABAPENTIN 800 MG PO TABS: 800.0000 mg | ORAL_TABLET | Freq: Two times a day (BID) | ORAL | 3 refills | Status: DC

## 2022-12-07 MED ORDER — CLONAZEPAM 0.5 MG PO TABS: 0.5000 mg | ORAL_TABLET | Freq: Every evening | ORAL | 2 refills | Status: DC | PRN

## 2022-12-07 NOTE — Progress Notes (Signed)
Office Visit  Subjective   Patient ID: Cassandra Schmidt   DOB: 10/28/1966   Age: 57 y.o.   MRN: [id number]   Chief Complaint Chief Complaint  Patient presents with   Follow-up    Diabetes     History of Present Illness The patient returns for followup of her anxiety.  Over the interim, she states that her anxiety is controlled and mild.  However she is not having problems with insomnia.  She did well with thaking her wellbutryin in the morning and slept well with no anxiety at night when she took clonazepam.  However, she ran out and states she still has anxiety at night and she is having some insomnia at times.  She also states that over the last 2-3 weeks she has had some hand trembling that occurs mostly in the morning.  She reports no additional symptoms She denies difficulty concentrating, fatigue, weight loss, insomnia, loss of appetite, social withdrawal, out of control feelings, and panic attacks. She currently lives with her husband. She has no significant prior history of mental health disorders.   She has a history of diabetic neuropathy of her hands and feet where we started her on gabapentin in 2017.  This past year her neuropathy pain was not controlled and we tried her on lyrica.  This did not help and we therefore switched her back to gabapentin where we decreased her gabapentin from 800mg  BiD 600mg  BID.  However, over the interim, her pain has worsened and she wants to go back to 800mg  TID.        Past Medical History Past Medical History:  Diagnosis Date   Anxiety    Arthritis    Depression    Diabetes mellitus without complication ([place])    Type II   Headache    Pneumonia    06/01/16- greater- than 5 years ago   Stroke [facility])    no residual effects     Allergies Allergies  Allergen Reactions   Keflex [Cephalexin] Anaphylaxis    Throat swelling   Bee Venom Swelling   Penicillins Itching, Rash and Other (See Comments)    Patient with history of ANAPHYLAXIS  with ORAL CEPHALOSPORINS  Has patient had a PCN reaction causing immediate rash, facial/tongue/throat swelling, SOB or lightheadedness with hypotension: Yes Has patient had a PCN reaction causing severe rash involving mucus membranes or skin necrosis: No Has patient had a PCN reaction that required hospitalization No Has patient had a PCN reaction occurring within the last 10 years: No If all of the above answers are "NO", then may proceed with Cephalosporin use.     Medications  Current Outpatient Medications:    clonazePAM (KLONOPIN) 0.5 MG tablet, Take 1 tablet (0.5 mg total) by mouth at bedtime as needed for anxiety., Disp: 30 tablet, Rfl: 2   estradiol (ESTRACE) 0.5 MG tablet, Take 0.5 mg by mouth daily., Disp: , Rfl:    gabapentin (NEURONTIN) 800 MG tablet, Take 1 tablet (800 mg total) by mouth 2 (two) times daily., Disp: 180 tablet, Rfl: 3   lisinopril (ZESTRIL) 2.5 MG tablet, Take 2.5 mg by mouth daily., Disp: , Rfl:    medroxyPROGESTERone (PROVERA) 2.5 MG tablet, Take 2.5 mg by mouth daily., Disp: , Rfl:    MOUNJARO 5 MG/0.5ML Pen, Inject 5 mg into the skin once a week., Disp: , Rfl:    TRADJENTA 5 MG TABS tablet, Take 5 mg by mouth daily., Disp: , Rfl:    acetaminophen (TYLENOL)  325 MG tablet, Take 650 mg by mouth every 6 (six) hours as needed., Disp: , Rfl:    b complex vitamins tablet, Take 1 tablet by mouth daily., Disp: , Rfl:    buPROPion (WELLBUTRIN XL) 150 MG 24 hr tablet, Take 1 tablet (150 mg total) by mouth daily., Disp: 90 tablet, Rfl: 1   Cholecalciferol (VITAMIN D-3) 1000 units CAPS, Take 2 capsules by mouth daily. , Disp: , Rfl:    glipiZIDE (GLUCOTROL XL) 5 MG 24 hr tablet, Take 10 mg by mouth 2 (two) times daily before a meal. Reported on 03/09/2016, Disp: , Rfl:    metFORMIN (GLUMETZA) 500 MG (MOD) 24 hr tablet, Take 2,000 mg by mouth daily with supper., Disp: , Rfl:    Review of Systems Review of Systems  Constitutional:  Negative for chills, fever and weight  loss.  Eyes:  Negative for blurred vision and double vision.  Respiratory:  Negative for cough, shortness of breath and wheezing.   Cardiovascular:  Negative for chest pain, palpitations and leg swelling.  Gastrointestinal:  Negative for abdominal pain, constipation, diarrhea, nausea and vomiting.  Musculoskeletal:  Negative for myalgias.  Skin:  Negative for itching and rash.  Neurological:  Negative for dizziness, weakness and headaches.  Psychiatric/Behavioral:  Negative for depression. The patient is nervous/anxious and has insomnia.        Objective:    Vitals BP 120/70   Pulse 90   Temp 98 F (36.7 C)   Resp 16   Ht 5\' 3"  (1.6 m)   Wt 165 lb 12.8 oz (75.2 kg)   SpO2 97%   BMI 29.37 kg/m    Physical Examination Physical Exam Constitutional:      Appearance: Normal appearance. She is not ill-appearing.  Cardiovascular:     Rate and Rhythm: Normal rate and regular rhythm.     Pulses: Normal pulses.     Heart sounds: No murmur heard.    No friction rub. No gallop.  Pulmonary:     Effort: Pulmonary effort is normal. No respiratory distress.     Breath sounds: No wheezing, rhonchi or rales.  Abdominal:     General: Abdomen is flat. Bowel sounds are normal. There is no distension.     Palpations: Abdomen is soft.     Tenderness: There is no abdominal tenderness.  Musculoskeletal:     Right lower leg: No edema.     Left lower leg: No edema.  Skin:    General: Skin is warm and dry.     Findings: No rash.  Neurological:     Mental Status: She is alert.        Assessment & Plan:   Diabetic polyneuropathy associated with type 2 diabetes mellitus ([place]) We will increase her gabapentin back to 800mg  TID.  Insomnia We will continue her on wellbutyrin and clonazpeam.  GAD (generalized anxiety disorder) Her anxiety is improved but worsened at night at times.  We will use clonazpeam prn.    No follow-ups on file.   Townsend Roger, MD

## 2022-12-07 NOTE — Assessment & Plan Note (Signed)
We will increase her gabapentin back to 800mg  TID.

## 2022-12-07 NOTE — Assessment & Plan Note (Signed)
Her anxiety is improved but worsened at night at times.  We will use clonazpeam prn.

## 2022-12-07 NOTE — Assessment & Plan Note (Signed)
We will continue her on wellbutyrin and clonazpeam.

## 2022-12-26 ENCOUNTER — Other Ambulatory Visit: Payer: Self-pay | Admitting: Internal Medicine

## 2022-12-27 ENCOUNTER — Other Ambulatory Visit: Payer: Self-pay | Admitting: Internal Medicine

## 2022-12-29 ENCOUNTER — Other Ambulatory Visit: Payer: Self-pay | Admitting: Internal Medicine

## 2023-01-25 ENCOUNTER — Other Ambulatory Visit: Payer: Self-pay | Admitting: Internal Medicine

## 2023-02-05 ENCOUNTER — Other Ambulatory Visit: Payer: Self-pay | Admitting: Internal Medicine

## 2023-02-15 ENCOUNTER — Other Ambulatory Visit: Payer: Self-pay | Admitting: Internal Medicine

## 2023-02-16 ENCOUNTER — Other Ambulatory Visit: Payer: Self-pay | Admitting: Internal Medicine

## 2023-02-28 NOTE — Telephone Encounter (Signed)
Pt is already on the 5mg  dosage

## 2023-03-05 ENCOUNTER — Other Ambulatory Visit: Payer: Self-pay | Admitting: Internal Medicine

## 2023-03-09 ENCOUNTER — Other Ambulatory Visit: Payer: Self-pay | Admitting: Internal Medicine

## 2023-03-10 ENCOUNTER — Other Ambulatory Visit: Payer: Self-pay | Admitting: Internal Medicine

## 2023-03-14 ENCOUNTER — Ambulatory Visit: Payer: BC Managed Care – PPO | Admitting: Internal Medicine

## 2023-03-14 ENCOUNTER — Encounter: Payer: Self-pay | Admitting: Internal Medicine

## 2023-03-14 ENCOUNTER — Other Ambulatory Visit: Payer: Self-pay | Admitting: Internal Medicine

## 2023-03-14 VITALS — BP 110/80 | HR 90 | Temp 98.3°F | Resp 16 | Ht 63.0 in | Wt 169.6 lb

## 2023-03-14 DIAGNOSIS — E1142 Type 2 diabetes mellitus with diabetic polyneuropathy: Secondary | ICD-10-CM | POA: Diagnosis not present

## 2023-03-14 LAB — HEMOGLOBIN A1C
Est. average glucose Bld gHb Est-mCnc: 186 mg/dL
Hgb A1c MFr Bld: 8.1 % — ABNORMAL HIGH (ref 4.8–5.6)

## 2023-03-14 MED ORDER — CLONAZEPAM 0.5 MG PO TABS: 0.5000 mg | ORAL_TABLET | Freq: Every evening | ORAL | 2 refills | Status: DC | PRN

## 2023-03-14 NOTE — Telephone Encounter (Signed)
Already sent in

## 2023-03-14 NOTE — Assessment & Plan Note (Addendum)
We will check her HgBA1c today with goal <7%.  She cannot tolerate ozempic or mounjaro due to GI side effects.  Her diabetic neuropathy pain seems to be controlled.  She did not go to gabapentin  TID but remains on BID dosing.  We will cotninue her meds as directed.  We could use [place] if her sugars are not controlled.

## 2023-03-14 NOTE — Progress Notes (Signed)
Office Visit  Subjective   Patient ID: Cassandra Schmidt   DOB: 09-16-1966   Age: 57 y.o.   MRN: [id number]   Chief Complaint Chief Complaint  Patient presents with   Follow-up    Diabetes     History of Present Illness The patient is a 57 year old Caucasian/White female who returns today for followup of her T2 diabetes.  On her last visit for diabetes, we did start her on mounjaro but this caused some abdominal pain and she stopped it.  Again, she had some abdominal pain last year where we felt it was from ozempic which we discontinued.  We did refer her to GI who felt this was likely related to her IBS.  He wanted her to finish her trial of protonix BID then stop this medication and continue bentyl as needed and he added equilactin.  Today, Mrs. Curb states that her abdominal pain is improved.  I saw her 3 months ago where her FSBS were running on the higher range with FSBS 150-200.  She remains on metformin XR  daily and glipizide ER  BID.  She checks blood sugars at least once per day and they tend to range around 90-120 mg/dl fasting.   Her last HgBa1c was done 6 months ago and was 6.9%.  She has no long term complications of diabetic nephropathy or retinopathy.  She has been followed for diabetic neuropathy of her hands and feet where we started her on gabapentin in 2017.  This past year her neuropathy pain was not controlled and we tried her on lyrica.  This did not help and we therefore switched her back to gabapentin.  She has a history of diabetic neuropathy of her hands and feet where we started her on gabapentin in 2017.  This past year her neuropathy pain was not controlled and we tried her on lyrica.  This did not help and we therefore switched her back to gabapentin where we decreased her gabapentin from  BID  BID.  However, on her last visit her pain has worsened and we increased her gabapentin back up to  TID.  She did not do this over the interim.  We  have tried her on Cymbalta  daily in the past.  She only took this for about 2 weeks but this causes some change in mood and she stopped it.   She was sent her for EMG/nerve conduction testing which was done on 01/01/2022 and this study of her bilateral lower extremities was within normal limits except for isolated findings of mildly diminished sural sensory amplitudes which is a reflection of her peripheral neuropathy.  She also states she is having some reflux symptoms where she was eating tums.  She goes back to Dr. Dan Humphreys for a yearly eye exam on 03/18/2023.      Past Medical History Past Medical History:  Diagnosis Date   Anxiety    Arthritis    Depression    Diabetes mellitus without complication    Type II   Headache    Pneumonia    06/01/16- greater- than 5 years ago   Stroke    no residual effects     Allergies Allergies  Allergen Reactions   Keflex [Cephalexin] Anaphylaxis    Throat swelling   Bee Venom Swelling   Penicillins Itching, Rash and Other (See Comments)    Patient with history of ANAPHYLAXIS with ORAL CEPHALOSPORINS  Has patient had a PCN reaction causing immediate rash, facial/tongue/throat  swelling, SOB or lightheadedness with hypotension: Yes Has patient had a PCN reaction causing severe rash involving mucus membranes or skin necrosis: No Has patient had a PCN reaction that required hospitalization No Has patient had a PCN reaction occurring within the last 10 years: No If all of the above answers are "NO", then may proceed with Cephalosporin use.     Medications  Current Outpatient Medications:    ACCU-CHEK AVIVA PLUS test strip, USE AS DIRECTED, Disp: 100 strip, Rfl: 3   acetaminophen (TYLENOL) 325 MG tablet, Take 650 mg by mouth every 6 (six) hours as needed., Disp: , Rfl:    b complex vitamins tablet, Take 1 tablet by mouth daily., Disp: , Rfl:    buPROPion (WELLBUTRIN XL) 150 MG 24 hr tablet, TAKE 1 TABLET BY MOUTH EVERY DAY, Disp: 30 tablet, Rfl:  2   Cholecalciferol (VITAMIN D-3) 1000 units CAPS, Take 2 capsules by mouth daily. , Disp: , Rfl:    clonazePAM (KLONOPIN) 0.5 MG tablet, Take 1 tablet (0.5 mg total) by mouth at bedtime as needed for anxiety., Disp: 30 tablet, Rfl: 2   estradiol (ESTRACE) 0.5 MG tablet, Take 0.5 mg by mouth daily., Disp: , Rfl:    gabapentin (NEURONTIN) 800 MG tablet, Take 1 tablet (800 mg total) by mouth 2 (two) times daily., Disp: 180 tablet, Rfl: 3   glipiZIDE (GLUCOTROL) 10 MG tablet, TAKE 1 TABLET BY MOUTH TWICE A DAY BEFORE MEALS, Disp: 180 tablet, Rfl: 1   Lancets (ONETOUCH DELICA PLUS LANCET33G) MISC, USE AS DIRECTED, Disp: 100 each, Rfl: 1   lisinopril (ZESTRIL) 2.5 MG tablet, TAKE 1 TABLET BY MOUTH EVERY DAY, Disp: 90 tablet, Rfl: 1   medroxyPROGESTERone (PROVERA) 2.5 MG tablet, Take 2.5 mg by mouth daily., Disp: , Rfl:    metFORMIN (GLUMETZA) 500 MG (MOD) 24 hr tablet, Take 2,000 mg by mouth daily with supper., Disp: , Rfl:    MOUNJARO 5 MG/0.5ML Pen, Inject 5 mg into the skin once a week., Disp: , Rfl:    saxagliptin HCl (ONGLYZA) 5 MG TABS tablet, Take 1 tablet (5 mg total) by mouth daily., Disp: 90 tablet, Rfl: 1   tirzepatide (MOUNJARO) 2.5 MG/0.5ML Pen, INJECT 2.5 MG SUBCUTANEOUSLY ONE TIME PER WEEK FOR 4 WEEKS, Disp: 0.5 mL, Rfl: 1   Review of Systems Review of Systems  Constitutional:  Negative for chills and fever.  Eyes:  Negative for blurred vision and double vision.  Respiratory:  Negative for cough and shortness of breath.   Cardiovascular:  Negative for chest pain, palpitations and leg swelling.  Gastrointestinal:  Negative for abdominal pain, constipation, diarrhea, nausea and vomiting.  Genitourinary:  Negative for urgency.  Musculoskeletal:  Negative for myalgias.  Neurological:  Negative for dizziness, weakness and headaches.  Endo/Heme/Allergies:  Negative for polydipsia.       Objective:    Vitals BP 110/80   Pulse 90   Temp 98.3 F (36.8 C)   Resp 16   Ht  (1.6  m)   Wt 169 lb 9.6 oz (76.9 kg)   SpO2 97%   BMI 30.04 kg/m    Physical Examination Physical Exam Constitutional:      Appearance: Normal appearance. She is not ill-appearing.  Cardiovascular:     Rate and Rhythm: Normal rate and regular rhythm.     Pulses: Normal pulses.     Heart sounds: No murmur heard.    No friction rub. No gallop.  Pulmonary:     Effort: Pulmonary  effort is normal. No respiratory distress.     Breath sounds: No wheezing, rhonchi or rales.  Abdominal:     General: Bowel sounds are normal. There is no distension.     Palpations: Abdomen is soft.     Tenderness: There is no abdominal tenderness.  Musculoskeletal:     Right lower leg: No edema.     Left lower leg: No edema.  Skin:    General: Skin is warm and dry.     Findings: No rash.  Neurological:     Mental Status: She is alert.        Assessment & Plan:   Diabetic polyneuropathy associated with type 2 diabetes mellitus (HCC) We will check her HgBA1c today with goal <7%.  She cannot tolerate ozempic or mounjaro due to GI side effects.  Her diabetic neuropathy pain seems to be controlled.  She did not go to gabapentin 800mg  TID but remains on BID dosing.  We will cotninue her meds as directed.    Return in about 4 months (around 07/05/2023) for annual.   Crist Fat, MD

## 2023-03-18 DIAGNOSIS — E119 Type 2 diabetes mellitus without complications: Secondary | ICD-10-CM | POA: Diagnosis not present

## 2023-03-26 ENCOUNTER — Other Ambulatory Visit: Payer: Self-pay

## 2023-03-26 MED ORDER — RYBELSUS 3 MG PO TABS: 3.0000 mg | ORAL_TABLET | Freq: Every day | ORAL | 0 refills | Status: DC

## 2023-03-29 ENCOUNTER — Other Ambulatory Visit: Payer: Self-pay | Admitting: Internal Medicine

## 2023-04-03 DIAGNOSIS — Z1231 Encounter for screening mammogram for malignant neoplasm of breast: Secondary | ICD-10-CM | POA: Diagnosis not present

## 2023-04-03 DIAGNOSIS — Z01419 Encounter for gynecological examination (general) (routine) without abnormal findings: Secondary | ICD-10-CM | POA: Diagnosis not present

## 2023-04-03 DIAGNOSIS — Z6829 Body mass index (BMI) 29.0-29.9, adult: Secondary | ICD-10-CM | POA: Diagnosis not present

## 2023-04-18 ENCOUNTER — Other Ambulatory Visit: Payer: Self-pay | Admitting: Internal Medicine

## 2023-04-24 ENCOUNTER — Ambulatory Visit: Payer: BC Managed Care – PPO | Admitting: Internal Medicine

## 2023-04-24 ENCOUNTER — Encounter: Payer: Self-pay | Admitting: Internal Medicine

## 2023-04-24 VITALS — BP 114/68 | HR 92 | Temp 98.0°F | Resp 16 | Ht 63.0 in | Wt 168.4 lb

## 2023-04-24 DIAGNOSIS — E1142 Type 2 diabetes mellitus with diabetic polyneuropathy: Secondary | ICD-10-CM | POA: Diagnosis not present

## 2023-04-24 MED ORDER — RYBELSUS 7 MG PO TABS: 1.0000 | ORAL_TABLET | Freq: Every day | ORAL | 3 refills | Status: DC

## 2023-04-24 NOTE — Assessment & Plan Note (Signed)
I want her to increase her rybelsus from 3mg  daily to 7mg  daily.  Continue her other meds and we will see her back in 06/2023.

## 2023-04-24 NOTE — Progress Notes (Signed)
Office Visit  Subjective   Patient ID: Cassandra Schmidt   DOB: 1966-06-16   Age: 57 y.o.   MRN: [id number]   Chief Complaint Chief Complaint  Patient presents with   elevated blood sugars    150-190 daily blood sugars     History of Present Illness The patient is a 57 year old Caucasian/White female who returns today due to have some acute hyperglycemia for the last 6-7 days.  She does have a history of T2 diabetes.  I saw her on 03/14/2023 and her HgBa1c was 8.1%.  At that time I started her on rybelsus as opposed to ozempic where she had GI side effects from ozempic (and mounjaro).  Today, she states she is checking her sugars once a day and her FSBS has been running higher with a value of 150-200.  Again, mounjaro and ozempic caused some abdominal pain.  We did refer her to GI who felt this was likely related to her IBS.  He wanted her to finish her trial of protonix BID then stop this medication and continue bentyl as needed and he added equilactin.  She remains on metformin XR 2000mg  daily, glipizide ER 10mg  BID, and rybelsus 3mg  daily.   She denies any problems with her rybelsus today.   She checks blood sugars at least once per day and they tend to range around 90-120 until last week where they are now running 150-200mg /dl fasting.   Her last HgBa1c was done on 03/14/2023 and was 8.1%.  She has no long term complications of diabetic nephropathy or retinopathy.  She has been followed for diabetic neuropathy of her hands and feet where we started her on gabapentin in 2017.  This past year her neuropathy pain was not controlled and we tried her on lyrica.  This did not help and we therefore switched her back to gabapentin.  She has a history of diabetic neuropathy of her hands and feet where we started her on gabapentin in 2017.  This past year her neuropathy pain was not controlled and we tried her on lyrica.  This did not help and we therefore switched her back to gabapentin where we decreased  her gabapentin from 800mg  BID 600mg  BID.  However, on her last visit her pain has worsened and we increased her gabapentin back up to 800mg  TID.  She did not do this over the interim.  We have tried her on Cymbalta 20mg  daily in the past.  She only took this for about 2 weeks but this causes some change in mood and she stopped it.   She was sent her for EMG/nerve conduction testing which was done on 01/01/2022 and this study of her bilateral lower extremities was within normal limits except for isolated findings of mildly diminished sural sensory amplitudes which is a reflection of her peripheral neuropathy.  She also states she is having some reflux symptoms where she was eating tums.  She did a yearly dilated diabetic eye exam by Dr. Dan Humphreys on 03/18/2023 and this showed no evidence of retinopathy.     Past Medical History Past Medical History:  Diagnosis Date   Anxiety    Arthritis    Depression    Diabetes mellitus without complication (HCC)    Type II   Headache    Pneumonia    06/01/16- greater- than 5 years ago   Stroke [facility])    no residual effects     Allergies Allergies  Allergen Reactions   Keflex [  Cephalexin] Anaphylaxis    Throat swelling   Bee Venom Swelling   Penicillins Itching, Rash and Other (See Comments)    Patient with history of ANAPHYLAXIS with ORAL CEPHALOSPORINS  Has patient had a PCN reaction causing immediate rash, facial/tongue/throat swelling, SOB or lightheadedness with hypotension: Yes Has patient had a PCN reaction causing severe rash involving mucus membranes or skin necrosis: No Has patient had a PCN reaction that required hospitalization No Has patient had a PCN reaction occurring within the last 10 years: No If all of the above answers are "NO", then may proceed with Cephalosporin use.     Medications  Current Outpatient Medications:    estradiol (ESTRACE) 1 MG tablet, Take 1 mg by mouth daily., Disp: , Rfl:    Semaglutide (RYBELSUS) 7 MG TABS, Take  1 tablet (7 mg total) by mouth daily., Disp: 90 tablet, Rfl: 3   ACCU-CHEK AVIVA PLUS test strip, USE AS DIRECTED, Disp: 100 strip, Rfl: 3   b complex vitamins tablet, Take 1 tablet by mouth daily., Disp: , Rfl:    buPROPion (WELLBUTRIN XL) 150 MG 24 hr tablet, TAKE 1 TABLET BY MOUTH EVERY DAY, Disp: 90 tablet, Rfl: 1   Cholecalciferol (VITAMIN D-3) 1000 units CAPS, Take 2 capsules by mouth daily. , Disp: , Rfl:    clonazePAM (KLONOPIN) 0.5 MG tablet, Take 1 tablet (0.5 mg total) by mouth at bedtime as needed for anxiety., Disp: 30 tablet, Rfl: 2   gabapentin (NEURONTIN) 800 MG tablet, Take 1 tablet (800 mg total) by mouth 2 (two) times daily., Disp: 180 tablet, Rfl: 3   glipiZIDE (GLUCOTROL) 10 MG tablet, TAKE 1 TABLET BY MOUTH TWICE A DAY BEFORE MEALS, Disp: 180 tablet, Rfl: 1   Lancets (ONETOUCH DELICA PLUS LANCET33G) MISC, USE AS DIRECTED, Disp: 100 each, Rfl: 1   lisinopril (ZESTRIL) 2.5 MG tablet, TAKE 1 TABLET BY MOUTH EVERY DAY, Disp: 90 tablet, Rfl: 1   metFORMIN (GLUMETZA) 500 MG (MOD) 24 hr tablet, Take 2,000 mg by mouth daily with supper., Disp: , Rfl:    Review of Systems Review of Systems  Constitutional:  Negative for chills and fever.  Eyes:  Negative for blurred vision and double vision.  Respiratory:  Negative for cough and shortness of breath.   Cardiovascular:  Negative for chest pain, palpitations and leg swelling.  Gastrointestinal:  Negative for abdominal pain, constipation, diarrhea, nausea and vomiting.  Musculoskeletal:  Negative for myalgias.  Neurological:  Negative for dizziness, weakness and headaches.       Objective:    Vitals BP 114/68 (BP Location: Left Arm, Patient Position: Sitting, Cuff Size: Normal)   Pulse 92   Temp 98 F (36.7 C) (Temporal)   Resp 16   Ht 5\' 3"  (1.6 m)   Wt 168 lb 6.4 oz (76.4 kg)   SpO2 97%   BMI 29.83 kg/m    Physical Examination Physical Exam Constitutional:      Appearance: Normal appearance. She is not  ill-appearing.  Cardiovascular:     Rate and Rhythm: Normal rate and regular rhythm.     Pulses: Normal pulses.     Heart sounds: No murmur heard.    No friction rub. No gallop.  Pulmonary:     Effort: Pulmonary effort is normal. No respiratory distress.     Breath sounds: No wheezing, rhonchi or rales.  Abdominal:     General: Bowel sounds are normal. There is no distension.     Palpations: Abdomen is soft.  Tenderness: There is no abdominal tenderness.  Musculoskeletal:     Right lower leg: No edema.     Left lower leg: No edema.  Skin:    General: Skin is warm and dry.     Findings: No rash.  Neurological:     Mental Status: She is alert.        Assessment & Plan:   Diabetic polyneuropathy associated with type 2 diabetes mellitus (HCC) I want her to increase her rybelsus from 3mg  daily to 7mg  daily.  Continue her other meds and we will see her back in 06/2023.    No follow-ups on file.   Crist Fat, MD

## 2023-06-05 ENCOUNTER — Other Ambulatory Visit: Payer: Self-pay | Admitting: Internal Medicine

## 2023-06-09 ENCOUNTER — Other Ambulatory Visit: Payer: Self-pay | Admitting: Internal Medicine

## 2023-06-13 ENCOUNTER — Other Ambulatory Visit: Payer: Self-pay | Admitting: Internal Medicine

## 2023-06-13 MED ORDER — CLONAZEPAM 0.5 MG PO TABS: 0.5000 mg | ORAL_TABLET | Freq: Every evening | ORAL | 2 refills | Status: DC | PRN

## 2023-06-28 ENCOUNTER — Other Ambulatory Visit: Payer: Self-pay

## 2023-06-28 MED ORDER — LISINOPRIL 2.5 MG PO TABS: 2.5000 mg | ORAL_TABLET | Freq: Every day | ORAL | 1 refills | Status: DC

## 2023-07-15 ENCOUNTER — Ambulatory Visit: Payer: BC Managed Care – PPO | Admitting: Internal Medicine

## 2023-07-15 ENCOUNTER — Encounter: Payer: Self-pay | Admitting: Internal Medicine

## 2023-07-15 VITALS — BP 120/72 | HR 92 | Temp 98.5°F | Resp 17 | Ht 63.0 in | Wt 175.6 lb

## 2023-07-15 DIAGNOSIS — Z0000 Encounter for general adult medical examination without abnormal findings: Secondary | ICD-10-CM | POA: Insufficient documentation

## 2023-07-15 DIAGNOSIS — E1142 Type 2 diabetes mellitus with diabetic polyneuropathy: Secondary | ICD-10-CM

## 2023-07-15 DIAGNOSIS — Z8249 Family history of ischemic heart disease and other diseases of the circulatory system: Secondary | ICD-10-CM | POA: Insufficient documentation

## 2023-07-15 DIAGNOSIS — Z6831 Body mass index (BMI) 31.0-31.9, adult: Secondary | ICD-10-CM

## 2023-07-15 DIAGNOSIS — M5432 Sciatica, left side: Secondary | ICD-10-CM | POA: Insufficient documentation

## 2023-07-15 DIAGNOSIS — F411 Generalized anxiety disorder: Secondary | ICD-10-CM

## 2023-07-15 DIAGNOSIS — I671 Cerebral aneurysm, nonruptured: Secondary | ICD-10-CM

## 2023-07-15 MED ORDER — RYBELSUS 7 MG PO TABS
1.0000 | ORAL_TABLET | Freq: Every day | ORAL | 3 refills | Status: DC
Start: 2023-07-15 — End: 2024-03-25

## 2023-07-15 MED ORDER — METFORMIN HCL ER 500 MG PO TB24
1000.0000 mg | ORAL_TABLET | Freq: Two times a day (BID) | ORAL | 5 refills | Status: DC
Start: 2023-07-15 — End: 2024-08-31

## 2023-07-15 MED ORDER — BUPROPION HCL ER (XL) 300 MG PO TB24
300.0000 mg | ORAL_TABLET | Freq: Every day | ORAL | 3 refills | Status: DC
Start: 2023-07-15 — End: 2024-05-15

## 2023-07-15 MED ORDER — GABAPENTIN 800 MG PO TABS
800.0000 mg | ORAL_TABLET | Freq: Two times a day (BID) | ORAL | 3 refills | Status: DC
Start: 2023-07-15 — End: 2024-08-31

## 2023-07-15 MED ORDER — DICLOFENAC SODIUM 75 MG PO TBEC
75.0000 mg | DELAYED_RELEASE_TABLET | Freq: Two times a day (BID) | ORAL | 0 refills | Status: AC
Start: 2023-07-15 — End: 2023-07-25

## 2023-07-15 MED ORDER — CLONAZEPAM 0.5 MG PO TABS
0.5000 mg | ORAL_TABLET | Freq: Every evening | ORAL | 2 refills | Status: DC | PRN
Start: 2023-07-15 — End: 2024-01-05

## 2023-07-15 NOTE — Assessment & Plan Note (Signed)
Health maintenance discussed.  We need to get her mammogram report from this year.  We will obtian some yearly labs.

## 2023-07-15 NOTE — Assessment & Plan Note (Signed)
I have asked her to hold the meloxicam and we will start her on diclofenac sodium as opposed to a medrol dose pak.

## 2023-07-15 NOTE — Progress Notes (Signed)
Office Visit  Subjective   Patient ID: Cassandra Schmidt   DOB: 12-09-1965   Age: 57 y.o.   MRN: [id number]   Chief Complaint Chief Complaint  Patient presents with   Annual Exam     History of Present Illness Cassandra Schmidt is a 57 year old Caucasian/White female who presents for her annual health maintenance exam. She is due for the following health maintenance studies: screening labs. This patient's past medical history Anxiety Disorder, Generalized, Brain Anuresym, Cervicalgia (neck pain), Degenerated Lumbar Disc, Diabetes Mellitus, Type II, Diabetic Neuropathy, and Vitamin D deficiency.   Her last year eye exam was done in 03/18/2023 which showed no evidence of diabetic retinopathy.  She has no problems with her vision.  She states she had a digital screening mammogram in 06/2022 but on our records, her last digital screening mammogram on 02/22/2022 which was normal. Her mother has a history of breast cancer and died from this. The patient had anemia of unknown cause in 2017 and I referred her to GI which felt her anemia was due to chronic menstrual blood loss plus possible occult GIB of unknown etiology. She underwent an EGD and colonoscopy on 07/2016 which showed a normal colonoscopy (followup in 10 years) and a normal EGD. They did biopsies for celiac sprue which was negative. She was then sent to her OB/GYN who noted she had mennorrhagia where she underwent endometrial biopsy which she states was negative. She was changed from progesterone to OCP however Dr. Aldona Bar felt she was postmenopausal when she saw him in 01/2022. She is currently on HRT.  She sees her GYN yearly with her last visit in 06/2022 where she doing well. The patient does exercise by walking daily.  The patient does not smoke.  She does not get yearly flu vaccines. She has had 2 Pfizer COVID vaccinations but no boosters. She denies any depression. There is no family history of heart disease and she is not on an ASA 81mg  daily.    The patient is a 57 year old Caucasian/White female who returns today for followup of her T2 diabetes.  On her last visit, her A1c was 8.1% and I asked her to increase her rybelsus from 3mg  to 7mg  daily.  I saw her on 03/14/2023 and her HgBa1c was 8.1%.  At that time I started her on rybelsus as opposed to ozempic where she had GI side effects from ozempic (and mounjaro).  Again, mounjaro and ozempic caused some abdominal pain.  We did refer her to GI who felt this was likely related to her IBS.  He wanted her to finish her trial of protonix BID then stop this medication and continue bentyl as needed and he added equilactin.  She remains on metformin XR 2000mg  daily, glipizide ER 10mg  BID, and rybelsus 7mg  daily.   She denies any problems with her rybelsus today.   She checks blood sugars at least once per day and they tend to range around 90-180 until last week where they are now running 150-200mg /dl fasting.   Her last HgBa1c was done 3 months ago and was 8.1%.  She has no long term complications of diabetic nephropathy or retinopathy.  She has been followed for diabetic neuropathy of her hands and feet where we started her on gabapentin in 2017.  This past year her neuropathy pain was not controlled and we tried her on lyrica.  This did not help and we therefore switched her back to gabapentin.  She  is currently on gabapentin from 800mg  BID.  We have tried her on Cymbalta 20mg  daily in the past.  She only took this for about 2 weeks but this causes some change in mood and she stopped it.   She was sent her for EMG/nerve conduction testing which was done on 01/01/2022 and this study of her bilateral lower extremities was within normal limits except for isolated findings of mildly diminished sural sensory amplitudes which is a reflection of her peripheral neuropathy.  She did a yearly dilated diabetic eye exam by Dr. Dan Humphreys on 03/18/2023 and this showed no evidence of retinopathy.  The patient also returns for  followup of her anxiety.  Last year, she was noted in 07/2022 to have some increased anxiety.  I started her on wellbutryrin XL 150mg  daily.  Today, she states that she is still having anxiety where this is moderate in intensity.  She does not have panic attacks.  She is also on clonazepam at night.   She reports no additional symptoms She denies difficulty concentrating, fatigue, weight loss, insomnia, loss of appetite, social withdrawal, out of control feelings, and panic attacks. She has no significant prior history of mental health disorders.    She has had anemia in the past where we discovered she had iron defiency anemia which she has had in the past.  Her Serum iron and ferritin levels were low.  I set her up for a ferrahema infusion of iron and unfortunately she had an adverse reaction where she turned red and had stomach pain for about 15 min.  Her infusion was stopped.  She cannot tolerate ferrous sulfate so we have placed her on ferrous gluconate which she tolerated.   She denies any vaginal bleeding and no BRBPR and no melena.    She also has a history of brain aneurysm where she underwent transcatheter embolization in 2017.  The patient was transfused 2 Units of pRBC's on 05/2016 before she was discharged from the hospital.  She has no residual effects from her bleeding aneurysm at this time and denies any new weakness/numbness, vision problems or speech problems.  Again, she had the anemia as described above and underwent workup. Cassandra Schmidt denies any BRBPR, melena and no bleeding or bruising.  She was on brillinta in the past by her physicians in [place] and was converted to 81mg  ASA.  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  She has a history of brain aneurysm with intervention. She states that on her last follwoup visit with radiology that they did not want to do  anything else. She is no longer on antiplatelet therapy.   The patient also states that she is having sciatica on the left side of her buttock/leg that started 2 weeks ago.  This is a shooting pain that starts in her buttock that shoots down the back of her leg to her calf.  She denies any trauma.  There is no new weakness/numbness of her feet/legs.  The patient started meloxicam which has helped.  However, she read about the side effects of lisinopril and meloxicam and felt this could effect her BP so she stopped the lisinopril.     Past Medical History Past Medical History:  Diagnosis Date   Anxiety    Arthritis    Depression    Diabetes mellitus without complication (HCC)    Type II  Headache    Pneumonia    06/01/16- greater- than 5 years ago   Stroke [facility])    no residual effects     Allergies Allergies  Allergen Reactions   Keflex [Cephalexin] Anaphylaxis    Throat swelling   Bee Venom Swelling   Penicillins Itching, Rash and Other (See Comments)    Patient with history of ANAPHYLAXIS with ORAL CEPHALOSPORINS  Has patient had a PCN reaction causing immediate rash, facial/tongue/throat swelling, SOB or lightheadedness with hypotension: Yes Has patient had a PCN reaction causing severe rash involving mucus membranes or skin necrosis: No Has patient had a PCN reaction that required hospitalization No Has patient had a PCN reaction occurring within the last 10 years: No If all of the above answers are "NO", then may proceed with Cephalosporin use.     Medications  Current Outpatient Medications:    ACCU-CHEK AVIVA PLUS test strip, USE AS DIRECTED, Disp: 100 strip, Rfl: 3   b complex vitamins tablet, Take 1 tablet by mouth daily., Disp: , Rfl:    Cholecalciferol (VITAMIN D-3) 1000 units CAPS, Take 2 capsules by mouth daily. , Disp: , Rfl:    clonazePAM (KLONOPIN) 0.5 MG tablet, Take 1 tablet (0.5 mg total) by mouth at bedtime as needed for anxiety., Disp: 30 tablet, Rfl: 2    estradiol (ESTRACE) 1 MG tablet, Take 1 mg by mouth daily., Disp: , Rfl:    gabapentin (NEURONTIN) 800 MG tablet, Take 1 tablet (800 mg total) by mouth 2 (two) times daily., Disp: 180 tablet, Rfl: 3   glipiZIDE (GLUCOTROL) 10 MG tablet, TAKE 1 TABLET BY MOUTH TWICE A DAY BEFORE MEALS, Disp: 180 tablet, Rfl: 1   Lancets (ONETOUCH DELICA PLUS LANCET33G) MISC, USE AS DIRECTED, Disp: 100 each, Rfl: 1   metFORMIN (GLUCOPHAGE-XR) 500 MG 24 hr tablet, TAKE 4 TABLETS BY MOUTH DAILY WITH EVENING MEAL, Disp: 360 tablet, Rfl: 5   Semaglutide (RYBELSUS) 7 MG TABS, Take 1 tablet (7 mg total) by mouth daily., Disp: 90 tablet, Rfl: 3   Review of Systems Review of Systems  Constitutional:  Negative for chills, fever, malaise/fatigue and weight loss.  Eyes:  Negative for blurred vision and double vision.  Respiratory:  Negative for cough, hemoptysis, shortness of breath and wheezing.   Cardiovascular:  Negative for chest pain, palpitations and leg swelling.  Gastrointestinal:  Negative for abdominal pain, blood in stool, constipation, diarrhea, heartburn, melena, nausea and vomiting.  Genitourinary:  Negative for frequency and hematuria.  Musculoskeletal:  Negative for back pain and myalgias.  Skin:  Negative for itching and rash.  Neurological:  Negative for dizziness, weakness and headaches.  Endo/Heme/Allergies:  Negative for polydipsia.  Psychiatric/Behavioral:  Positive for depression.        Objective:    Vitals BP 120/72   Pulse 92   Temp 98.5 F (36.9 C)   Resp 17   Ht 5\' 3"  (1.6 m)   Wt 175 lb 9.6 oz (79.7 kg)   SpO2 97%   BMI 31.11 kg/m    Physical Examination Physical Exam Constitutional:      Appearance: Normal appearance. She is not ill-appearing.  HENT:     Head: Normocephalic and atraumatic.     Right Ear: Tympanic membrane, ear canal and external ear normal.     Left Ear: Tympanic membrane, ear canal and external ear normal.     Nose: Nose normal. No congestion or  rhinorrhea.     Mouth/Throat:     Mouth: Mucous  membranes are moist.     Pharynx: Oropharynx is clear. No oropharyngeal exudate or posterior oropharyngeal erythema.  Eyes:     General: No scleral icterus.       Left eye: No discharge.     Conjunctiva/sclera: Conjunctivae normal.     Pupils: Pupils are equal, round, and reactive to light.  Neck:     Vascular: No carotid bruit.  Cardiovascular:     Rate and Rhythm: Normal rate and regular rhythm.     Pulses: Normal pulses.     Heart sounds: No murmur heard.    No friction rub. No gallop.  Pulmonary:     Effort: Pulmonary effort is normal. No respiratory distress.     Breath sounds: No wheezing, rhonchi or rales.  Abdominal:     General: Bowel sounds are normal. There is no distension.     Palpations: Abdomen is soft.     Tenderness: There is no abdominal tenderness.  Musculoskeletal:     Cervical back: Neck supple. No tenderness.     Right lower leg: No edema.     Left lower leg: No edema.  Lymphadenopathy:     Cervical: No cervical adenopathy.  Skin:    General: Skin is warm and dry.     Findings: No rash.  Neurological:     General: No focal deficit present.     Mental Status: She is alert and oriented to person, place, and time.  Psychiatric:        Mood and Affect: Mood normal.        Behavior: Behavior normal.        Assessment & Plan:   Brain aneurysm She had embolization in 2017 and this is not a current problem at this time.  Diabetic polyneuropathy associated with type 2 diabetes mellitus (HCC) We will check her HgBA1c today as we went up on her dose of rybelsus on her last visit.  Her diabetic foot exam was normal.  We will obtain urine studies today.  Sciatica of left side I have asked her to hold the meloxicam and we will start her on diclofenac sodium as opposed to a medrol dose pak.    GAD (generalized anxiety disorder) She states her anxiety is not controlled.  We will increase her wellbutrin XL  150mg  to 300mg  daily.  BMI 31.0-31.9,adult I want her to eat healthy, exercise more and lose weight.  Annual physical exam Health maintenance discussed.  We need to get her mammogram report from this year.  We will obtian some yearly labs.    Return in about 3 months (around 10/15/2023).   Crist Fat, MD

## 2023-07-15 NOTE — Assessment & Plan Note (Signed)
I want her to eat healthy, exercise more and lose weight.

## 2023-07-15 NOTE — Assessment & Plan Note (Signed)
We will check her HgBA1c today as we went up on her dose of rybelsus on her last visit.  Her diabetic foot exam was normal.  We will obtain urine studies today.

## 2023-07-15 NOTE — Assessment & Plan Note (Signed)
She had embolization in 2017 and this is not a current problem at this time.

## 2023-07-15 NOTE — Assessment & Plan Note (Signed)
She states her anxiety is not controlled.  We will increase her wellbutrin XL 150mg  to 300mg  daily.

## 2023-07-16 LAB — HEMOGLOBIN A1C
Est. average glucose Bld gHb Est-mCnc: 192 mg/dL
Hgb A1c MFr Bld: 8.3 % — ABNORMAL HIGH (ref 4.8–5.6)

## 2023-07-16 LAB — LIPID PANEL
Chol/HDL Ratio: 3 ratio (ref 0.0–4.4)
Cholesterol, Total: 183 mg/dL (ref 100–199)
HDL: 61 mg/dL (ref >39)
LDL Chol Calc (NIH): 105 mg/dL — ABNORMAL HIGH (ref 0–99)
Triglycerides: 96 mg/dL (ref 0–149)
VLDL Cholesterol Cal: 17 mg/dL (ref 5–40)

## 2023-07-16 LAB — CMP14 + ANION GAP
ALT: 22 IU/L (ref 0–32)
AST: 23 IU/L (ref 0–40)
Albumin: 3.9 g/dL (ref 3.8–4.9)
Alkaline Phosphatase: 62 IU/L (ref 44–121)
Anion Gap: 14 mmol/L (ref 10.0–18.0)
BUN/Creatinine Ratio: 19 (ref 9–23)
BUN: 14 mg/dL (ref 6–24)
Bilirubin Total: 0.4 mg/dL (ref 0.0–1.2)
CO2: 22 mmol/L (ref 20–29)
Calcium: 9 mg/dL (ref 8.7–10.2)
Chloride: 106 mmol/L (ref 96–106)
Creatinine, Ser: 0.75 mg/dL (ref 0.57–1.00)
Globulin, Total: 1.9 g/dL (ref 1.5–4.5)
Glucose: 114 mg/dL — ABNORMAL HIGH (ref 70–99)
Potassium: 4.3 mmol/L (ref 3.5–5.2)
Sodium: 142 mmol/L (ref 134–144)
Total Protein: 5.8 g/dL — ABNORMAL LOW (ref 6.0–8.5)
eGFR: 95 mL/min/{1.73_m2} (ref >59)

## 2023-07-16 LAB — CBC WITH DIFFERENTIAL/PLATELET
Basophils Absolute: 0 10*3/uL (ref 0.0–0.2)
Basos: 1 %
EOS (ABSOLUTE): 0.2 10*3/uL (ref 0.0–0.4)
Eos: 3 %
Hematocrit: 41.4 % (ref 34.0–46.6)
Hemoglobin: 13.2 g/dL (ref 11.1–15.9)
Immature Grans (Abs): 0 10*3/uL (ref 0.0–0.1)
Immature Granulocytes: 0 %
Lymphocytes Absolute: 1.8 10*3/uL (ref 0.7–3.1)
Lymphs: 31 %
MCH: 27.9 pg (ref 26.6–33.0)
MCHC: 31.9 g/dL (ref 31.5–35.7)
MCV: 88 fL (ref 79–97)
Monocytes Absolute: 0.4 10*3/uL (ref 0.1–0.9)
Monocytes: 7 %
Neutrophils Absolute: 3.4 10*3/uL (ref 1.4–7.0)
Neutrophils: 58 %
Platelets: 284 10*3/uL (ref 150–450)
RBC: 4.73 x10E6/uL (ref 3.77–5.28)
RDW: 14.2 % (ref 11.7–15.4)
WBC: 5.8 10*3/uL (ref 3.4–10.8)

## 2023-07-16 LAB — MICROALBUMIN / CREATININE URINE RATIO
Creatinine, Urine: 119.8 mg/dL
Microalb/Creat Ratio: 3 mg/g creat (ref 0–29)
Microalbumin, Urine: 3.4 ug/mL

## 2023-07-16 LAB — TSH: TSH: 2.33 u[IU]/mL (ref 0.450–4.500)

## 2023-07-17 ENCOUNTER — Other Ambulatory Visit: Payer: Self-pay

## 2023-07-17 MED ORDER — INSULIN GLARGINE 100 UNIT/ML ~~LOC~~ SOLN: 7.0000 [IU] | Freq: Every day | SUBCUTANEOUS | 11 refills | Status: DC

## 2023-07-17 NOTE — Progress Notes (Signed)
Her diabetes is not controlled. I want her to add lantus 7 Units daily. She needs to check her FSBS twice a day with adding this medication.  Called Patient. Aware of results, instructions, and new rx

## 2023-07-17 NOTE — Progress Notes (Unsigned)
Rx

## 2023-07-24 ENCOUNTER — Other Ambulatory Visit: Payer: Self-pay

## 2023-07-25 ENCOUNTER — Other Ambulatory Visit: Payer: Self-pay

## 2023-07-26 ENCOUNTER — Encounter: Payer: Self-pay | Admitting: Internal Medicine

## 2023-07-26 ENCOUNTER — Ambulatory Visit: Payer: BC Managed Care – PPO | Admitting: Internal Medicine

## 2023-07-26 ENCOUNTER — Other Ambulatory Visit: Payer: Self-pay

## 2023-07-26 VITALS — BP 124/72 | HR 96 | Temp 97.9°F | Resp 16 | Ht 63.0 in | Wt 175.9 lb

## 2023-07-26 DIAGNOSIS — M5416 Radiculopathy, lumbar region: Secondary | ICD-10-CM | POA: Insufficient documentation

## 2023-07-26 DIAGNOSIS — M47816 Spondylosis without myelopathy or radiculopathy, lumbar region: Secondary | ICD-10-CM | POA: Diagnosis not present

## 2023-07-26 DIAGNOSIS — M4317 Spondylolisthesis, lumbosacral region: Secondary | ICD-10-CM | POA: Diagnosis not present

## 2023-07-26 DIAGNOSIS — M545 Low back pain, unspecified: Secondary | ICD-10-CM | POA: Diagnosis not present

## 2023-07-26 DIAGNOSIS — M5126 Other intervertebral disc displacement, lumbar region: Secondary | ICD-10-CM | POA: Diagnosis not present

## 2023-07-26 MED ORDER — OXYCODONE HCL 5 MG PO TABS: 5.0000 mg | ORAL_TABLET | Freq: Four times a day (QID) | ORAL | 0 refills | Status: DC | PRN

## 2023-07-26 MED ORDER — METHYLPREDNISOLONE 4 MG PO TBPK: ORAL_TABLET | ORAL | 0 refills | Status: DC

## 2023-07-26 NOTE — Assessment & Plan Note (Signed)
I do not think she has a DVT or other problem.  I think she has a lumbar issue with radiculopathy at this point.  We will obtain a xray of her lumbar spine.  I think she needs a MRI of her lumbar spine as well.  She is to stop all her other NSAIDS and start on a medrol dose pak at this time.  She is having a lot of pain and we will also start her on oxycodone 5mg  q 6 hrs prn.  I reviewed her [place] controlled substance registry.  She may need referral to orthospine.

## 2023-07-26 NOTE — Progress Notes (Signed)
Office Visit  Subjective   Patient ID: Cassandra Schmidt   DOB: 07/09/1966   Age: 57 y.o.   MRN: [id number]   Chief Complaint No chief complaint on file.    History of Present Illness Cassandra Schmidt is a 57 yo female who comes in today with acute left sided leg pain located in her left calf.  She states this started 2 weeks ago where she is having a dull aching that only occurs when she is walking.  She states the pain is severe and rated 9 out 10 where she states her leg has buckled under her but she has not fallen.  She denies any trauma or injury.  She did see me on 07/15/2023 where she was having sciatica on the left side of her buttock/leg that started 2 weeks prior.  This is a shooting pain that starts in her buttock that shoots down the back of her leg to her calf.  She denies any trauma.  There was no new weakness/numbness of her feet/legs but she is now having numbness in the back of her left calf.  The patient started meloxicam which had helped, however, she read about the side effects of lisinopril and meloxicam and felt this could effect her BP so she stopped the lisinopril.  I felt she had sciatica and had her hold her meloxicam and started her on diclofenac sodium as opposed to a medrol dose pak.  There is no loss of bowel/bladder function and no new weakness but she is having some new numbness.      Past Medical History Past Medical History:  Diagnosis Date   Anxiety    Arthritis    Depression    Diabetes mellitus without complication (HCC)    Type II   Headache    Pneumonia    06/01/16- greater- than 5 years ago   Stroke [facility])    no residual effects     Allergies Allergies  Allergen Reactions   Keflex [Cephalexin] Anaphylaxis    Throat swelling   Bee Venom Swelling   Penicillins Itching, Rash and Other (See Comments)    Patient with history of ANAPHYLAXIS with ORAL CEPHALOSPORINS  Has patient had a PCN reaction causing immediate rash, facial/tongue/throat swelling,  SOB or lightheadedness with hypotension: Yes Has patient had a PCN reaction causing severe rash involving mucus membranes or skin necrosis: No Has patient had a PCN reaction that required hospitalization No Has patient had a PCN reaction occurring within the last 10 years: No If all of the above answers are "NO", then may proceed with Cephalosporin use.     Medications  Current Outpatient Medications:    ACCU-CHEK AVIVA PLUS test strip, USE AS DIRECTED, Disp: 100 strip, Rfl: 3   b complex vitamins tablet, Take 1 tablet by mouth daily., Disp: , Rfl:    buPROPion (WELLBUTRIN XL) 300 MG 24 hr tablet, Take 1 tablet (300 mg total) by mouth daily., Disp: 90 tablet, Rfl: 3   Cholecalciferol (VITAMIN D-3) 1000 units CAPS, Take 2 capsules by mouth daily. , Disp: , Rfl:    clonazePAM (KLONOPIN) 0.5 MG tablet, Take 1 tablet (0.5 mg total) by mouth at bedtime as needed for anxiety., Disp: 30 tablet, Rfl: 2   estradiol (ESTRACE) 1 MG tablet, Take 1 mg by mouth daily., Disp: , Rfl:    gabapentin (NEURONTIN) 800 MG tablet, Take 1 tablet (800 mg total) by mouth 2 (two) times daily., Disp: 180 tablet, Rfl: 3   glipiZIDE (  GLUCOTROL) 10 MG tablet, TAKE 1 TABLET BY MOUTH TWICE A DAY BEFORE MEALS, Disp: 180 tablet, Rfl: 1   insulin glargine (LANTUS) 100 UNIT/ML injection, Inject 0.07 mLs (7 Units total) into the skin daily., Disp: 10 mL, Rfl: 11   Lancets (ONETOUCH DELICA PLUS LANCET33G) MISC, USE AS DIRECTED, Disp: 100 each, Rfl: 1   metFORMIN (GLUCOPHAGE-XR) 500 MG 24 hr tablet, Take 2 tablets (1,000 mg total) by mouth 2 (two) times daily with a meal., Disp: 360 tablet, Rfl: 5   Semaglutide (RYBELSUS) 7 MG TABS, Take 1 tablet (7 mg total) by mouth daily., Disp: 90 tablet, Rfl: 3   Review of Systems Review of Systems  Constitutional:  Negative for chills and fever.  Respiratory:  Negative for cough and shortness of breath.   Cardiovascular:  Negative for leg swelling.  Gastrointestinal:  Negative for  abdominal pain, constipation, diarrhea, nausea and vomiting.  Neurological:  Negative for dizziness, weakness and headaches.       Objective:    Vitals BP 124/72   Pulse 96   Temp 97.9 F (36.6 C)   Resp 16   Ht 5\' 3"  (1.6 m)   Wt 175 lb 14.4 oz (79.8 kg)   SpO2 98%   BMI 31.16 kg/m    Physical Examination Physical Exam Constitutional:      Appearance: Normal appearance. She is not ill-appearing.  Cardiovascular:     Rate and Rhythm: Normal rate and regular rhythm.     Pulses: Normal pulses.     Heart sounds: No murmur heard.    No friction rub. No gallop.  Pulmonary:     Effort: Pulmonary effort is normal. No respiratory distress.     Breath sounds: No wheezing, rhonchi or rales.  Abdominal:     General: Bowel sounds are normal. There is no distension.     Palpations: Abdomen is soft.     Tenderness: There is no abdominal tenderness.  Musculoskeletal:     Right lower leg: No edema.     Left lower leg: No edema.  Skin:    General: Skin is warm and dry.     Findings: No rash.  Neurological:     Mental Status: She is alert.     Comments: She pain of her lower spine on the left over her SI joint and over her left sciatic nerve below her left buttock.  I cannot palpate any pain of her calf and cannot illicit any pain with Homan's sign on the left.  There is no swelling of her left leg or calf.  Her strength is 5/5 in her bilateral lower extremities.  On exam, when I did strength testing, she had active pain of her sciatic nerve on the left.        Assessment & Plan:   Lumbar radiculopathy, acute I do not think she has a DVT or other problem.  I think she has a lumbar issue with radiculopathy at this point.  We will obtain a xray of her lumbar spine.  I think she needs a MRI of her lumbar spine as well.  She is to stop all her other NSAIDS and start on a medrol dose pak at this time.  She is having a lot of pain and we will also start her on oxycodone 5mg  q 6 hrs prn.  I  reviewed her [place] controlled substance registry.  She may need referral to orthospine.    No follow-ups on file.   Crist Fat,  MD

## 2023-08-06 DIAGNOSIS — M5136 Other intervertebral disc degeneration, lumbar region: Secondary | ICD-10-CM | POA: Diagnosis not present

## 2023-08-06 DIAGNOSIS — M4317 Spondylolisthesis, lumbosacral region: Secondary | ICD-10-CM | POA: Diagnosis not present

## 2023-08-06 DIAGNOSIS — M4807 Spinal stenosis, lumbosacral region: Secondary | ICD-10-CM | POA: Diagnosis not present

## 2023-08-06 DIAGNOSIS — M5021 Other cervical disc displacement,  high cervical region: Secondary | ICD-10-CM | POA: Diagnosis not present

## 2023-08-27 ENCOUNTER — Other Ambulatory Visit: Payer: Self-pay | Admitting: Internal Medicine

## 2023-09-02 DIAGNOSIS — M4712 Other spondylosis with myelopathy, cervical region: Secondary | ICD-10-CM | POA: Diagnosis not present

## 2023-09-02 DIAGNOSIS — M79605 Pain in left leg: Secondary | ICD-10-CM | POA: Diagnosis not present

## 2023-09-02 DIAGNOSIS — M5416 Radiculopathy, lumbar region: Secondary | ICD-10-CM | POA: Diagnosis not present

## 2023-09-02 DIAGNOSIS — Z6830 Body mass index (BMI) 30.0-30.9, adult: Secondary | ICD-10-CM | POA: Diagnosis not present

## 2023-09-02 DIAGNOSIS — M5412 Radiculopathy, cervical region: Secondary | ICD-10-CM | POA: Diagnosis not present

## 2023-09-03 ENCOUNTER — Encounter: Payer: Self-pay | Admitting: Internal Medicine

## 2023-09-05 DIAGNOSIS — M545 Low back pain, unspecified: Secondary | ICD-10-CM | POA: Diagnosis not present

## 2023-09-05 DIAGNOSIS — M5416 Radiculopathy, lumbar region: Secondary | ICD-10-CM | POA: Diagnosis not present

## 2023-09-11 DIAGNOSIS — M5416 Radiculopathy, lumbar region: Secondary | ICD-10-CM | POA: Diagnosis not present

## 2023-09-16 DIAGNOSIS — M545 Low back pain, unspecified: Secondary | ICD-10-CM | POA: Diagnosis not present

## 2023-09-16 DIAGNOSIS — M5416 Radiculopathy, lumbar region: Secondary | ICD-10-CM | POA: Diagnosis not present

## 2023-09-19 DIAGNOSIS — M4712 Other spondylosis with myelopathy, cervical region: Secondary | ICD-10-CM | POA: Diagnosis not present

## 2023-09-19 DIAGNOSIS — M4802 Spinal stenosis, cervical region: Secondary | ICD-10-CM | POA: Diagnosis not present

## 2023-09-19 DIAGNOSIS — E042 Nontoxic multinodular goiter: Secondary | ICD-10-CM | POA: Diagnosis not present

## 2023-09-19 DIAGNOSIS — M47813 Spondylosis without myelopathy or radiculopathy, cervicothoracic region: Secondary | ICD-10-CM | POA: Diagnosis not present

## 2023-09-23 DIAGNOSIS — M545 Low back pain, unspecified: Secondary | ICD-10-CM | POA: Diagnosis not present

## 2023-09-23 DIAGNOSIS — M5416 Radiculopathy, lumbar region: Secondary | ICD-10-CM | POA: Diagnosis not present

## 2023-09-30 DIAGNOSIS — M5416 Radiculopathy, lumbar region: Secondary | ICD-10-CM | POA: Diagnosis not present

## 2023-09-30 DIAGNOSIS — M545 Low back pain, unspecified: Secondary | ICD-10-CM | POA: Diagnosis not present

## 2023-10-07 DIAGNOSIS — M545 Low back pain, unspecified: Secondary | ICD-10-CM | POA: Diagnosis not present

## 2023-10-07 DIAGNOSIS — M5416 Radiculopathy, lumbar region: Secondary | ICD-10-CM | POA: Diagnosis not present

## 2023-10-09 ENCOUNTER — Ambulatory Visit: Payer: BC Managed Care – PPO | Admitting: Internal Medicine

## 2023-10-09 ENCOUNTER — Encounter: Payer: Self-pay | Admitting: Internal Medicine

## 2023-10-09 VITALS — BP 130/68 | HR 97 | Temp 98.3°F | Resp 16 | Ht 63.0 in | Wt 170.6 lb

## 2023-10-09 DIAGNOSIS — M5416 Radiculopathy, lumbar region: Secondary | ICD-10-CM

## 2023-10-09 DIAGNOSIS — E1165 Type 2 diabetes mellitus with hyperglycemia: Secondary | ICD-10-CM | POA: Diagnosis not present

## 2023-10-09 NOTE — Assessment & Plan Note (Signed)
She has lumbar and cervical radiculopathy.  She has had an ESI and physical therapy that is helping a lot.  She goes back to see neurospine this week.

## 2023-10-09 NOTE — Assessment & Plan Note (Signed)
She is now taking lantus 7 Units daily in addition to her other meds.  We will check a HgBA1c today.  I want her to check her FSBS before she eats.

## 2023-10-09 NOTE — Progress Notes (Signed)
Office Visit  Subjective   Patient ID: Cassandra Schmidt   DOB: 02/15/1966   Age: 57 y.o.   MRN: [id number]   Chief Complaint Chief Complaint  Patient presents with   Follow-up     History of Present Illness Cassandra Schmidt is a 57 yo female who comes in today with acute left sided leg pain located in her left calf.  She states this started 2 weeks ago where she is having a dull aching that only occurs when she is walking.  She states the pain is severe and rated 9 out 10 where she states her leg has buckled under her but she has not fallen.  She denies any trauma or injury.  She did see me on 07/15/2023 where she was having sciatica on the left side of her buttock/leg that started 2 weeks prior.  This is a shooting pain that starts in her buttock that shoots down the back of her leg to her calf.  She denies any trauma.  There was no new weakness/numbness of her feet/legs but she is now having numbness in the back of her left calf.  The patient started meloxicam which had helped, however, she read about the side effects of lisinopril and meloxicam and felt this could effect her BP so she stopped the lisinopril.  I felt she had sciatica and had her hold her meloxicam and started her on diclofenac sodium as opposed to a medrol dose pak.  There is no loss of bowel/bladder function and no new weakness but she is having some new numbness.   Lumbar radiculopathy, acute I do not think she has a DVT or other problem.  I think she has a lumbar issue with radiculopathy at this point.  We will obtain a xray of her lumbar spine.  I think she needs a MRI of her lumbar spine as well.  She is to stop all her other NSAIDS and start on a medrol dose pak at this time.  She is having a lot of pain and we will also start her on oxycodone 5mg  q 6 hrs prn.  I reviewed her [place] controlled substance registry.  She may need referral to orthospine.  Lumbar xray done on 07/26/2023  Grade 2 anterolisthesis of L5-S1 favored due  to chronic pars defects. 2. Moderate degenerative changes of the lower lumbar spine.  Lumbar MRI 08/06/2023  Broad-based disc protrusion at L3-L4 resulting in left subarticular zone narrowing which may affect the traversing L4 nerve root. 2. Disc bulge and superimposed prominent left subarticular zone protrusion at L4-L5 which impinges the traversing L5 nerve root. 3. Grade 2 anterolisthesis of L5 on S1 with associated pars defects resulting in severe bilateral neural foraminal stenosis.  Cervical MRI on 09/19/2023  Degenerative changes of the cervical spine with moderate to severe spinal canal stenosis at C5-6 and moderate at C4-5 and C6-7. 2. Moderate bilateral neural foraminal narrowing at C6-7 and C7-T1. 3. Incidental thyroid nodules measuring up to 2.5 cm. Recommend non-emergent thyroid ultrasound.\  We referred her to [facility] where she saw Cassandra Schmidt on 09/02/2023.  He felt she had a left leg radiculopathy due to a L4-L5 paracentral disc herniation with a L5-S1 grade 2 isthmic spondylolithesis with high pelvic incidence and grade 1 L4-L5 anterolisthesis.  He wanted her to get a MRI of her cervical spine and set her up for physical therapy which she is doing at this time.  He wanted her to continue gabapentin.  She underwent a  L4-5 epidural steroid injection on 09/11/2023 and overall the patient states that the [facility] and physical therapy has helped with her pain.  The patient is a 57 year old Caucasian/White female who returns today for followup of her T2 diabetes.  On her last visit, her A1c was elevated and I asked her to start on lantus 7 Units daily.  I saw her on 03/14/2023 and her HgBa1c was 8.1%.  At that time I started her on rybelsus as opposed to ozempic where she had GI side effects from ozempic (and mounjaro).  Again, mounjaro and ozempic caused some abdominal pain.  We did refer her to GI who felt this was likely related to her IBS.  He wanted her to  finish her trial of protonix BID then stop this medication and continue bentyl as needed and he added equilactin.  She remains on metformin XR 2000mg  daily, glipizide ER 10mg  BID, Lantus 7 Units daily, and rybelsus 7mg  daily.   She denies any problems today.   She checks blood sugars at least once per day and they tend to range around 90-140 in AM and she was checking them after she eats dinner 200-300's.  Her last HgBa1c was done 3 months ago and was 8.3%.  She has no long term complications of diabetic nephropathy or retinopathy.  She has been followed for diabetic neuropathy of her hands and feet where we started her on gabapentin in 2017.  This past year her neuropathy pain was not controlled and we tried her on lyrica.  This did not help and we therefore switched her back to gabapentin.  She is currently on gabapentin from 800mg  BID.  We have tried her on Cymbalta 20mg  daily in the past.  She only took this for about 2 weeks but this causes some change in mood and she stopped it.   She was sent her for EMG/nerve conduction testing which was done on 01/01/2022 and this study of her bilateral lower extremities was within normal limits except for isolated findings of mildly diminished sural sensory amplitudes which is a reflection of her peripheral neuropathy.  She did a yearly dilated diabetic eye exam by Cassandra Schmidt on 03/18/2023 and this showed no evidence of retinopathy.  Cassandra Schmidt also states this past week she developed nausea, vomiting and diarrhea that started 2 days ago.  This started with vomiting, She denies any bile vomiting and her diarrhea was bad a few days ago but this has slowly improved.  She has not tried anything for her n/v, or diarrhea.     Past Medical History Past Medical History:  Diagnosis Date   Anxiety    Arthritis    Depression    Diabetes mellitus without complication (HCC)    Type II   Headache    Pneumonia    06/01/16- greater- than 5 years ago   Stroke [facility])    no  residual effects     Allergies Allergies  Allergen Reactions   Keflex [Cephalexin] Anaphylaxis    Throat swelling   Bee Venom Swelling   Penicillins Itching, Rash and Other (See Comments)    Patient with history of ANAPHYLAXIS with ORAL CEPHALOSPORINS  Has patient had a PCN reaction causing immediate rash, facial/tongue/throat swelling, SOB or lightheadedness with hypotension: Yes Has patient had a PCN reaction causing severe rash involving mucus membranes or skin necrosis: No Has patient had a PCN reaction that required hospitalization No Has patient had a PCN reaction occurring  within the last 10 years: No If all of the above answers are "NO", then may proceed with Cephalosporin use.     Medications  Current Outpatient Medications:    ACCU-CHEK AVIVA PLUS test strip, USE AS DIRECTED, Disp: 100 strip, Rfl: 3   b complex vitamins tablet, Take 1 tablet by mouth daily., Disp: , Rfl:    buPROPion (WELLBUTRIN XL) 300 MG 24 hr tablet, Take 1 tablet (300 mg total) by mouth daily., Disp: 90 tablet, Rfl: 3   Cholecalciferol (VITAMIN D-3) 1000 units CAPS, Take 2 capsules by mouth daily. , Disp: , Rfl:    clonazePAM (KLONOPIN) 0.5 MG tablet, Take 1 tablet (0.5 mg total) by mouth at bedtime as needed for anxiety., Disp: 30 tablet, Rfl: 2   estradiol (ESTRACE) 1 MG tablet, Take 1 mg by mouth daily., Disp: , Rfl:    gabapentin (NEURONTIN) 800 MG tablet, Take 1 tablet (800 mg total) by mouth 2 (two) times daily., Disp: 180 tablet, Rfl: 3   glipiZIDE (GLUCOTROL) 10 MG tablet, TAKE 1 TABLET BY MOUTH TWICE A DAY BEFORE MEALS, Disp: 180 tablet, Rfl: 1   insulin glargine (LANTUS) 100 UNIT/ML injection, Inject 0.07 mLs (7 Units total) into the skin daily., Disp: 10 mL, Rfl: 11   Lancets (ONETOUCH DELICA PLUS LANCET33G) MISC, USE AS DIRECTED, Disp: 100 each, Rfl: 1   metFORMIN (GLUCOPHAGE-XR) 500 MG 24 hr tablet, Take 2 tablets (1,000 mg total) by mouth 2 (two) times daily with a meal., Disp: 360 tablet,  Rfl: 5   Semaglutide (RYBELSUS) 7 MG TABS, Take 1 tablet (7 mg total) by mouth daily., Disp: 90 tablet, Rfl: 3   Review of Systems Review of Systems  Constitutional:  Negative for chills and fever.  Respiratory:  Negative for shortness of breath.   Cardiovascular:  Negative for chest pain, palpitations and leg swelling.  Gastrointestinal:  Negative for abdominal pain, blood in stool, constipation, diarrhea, nausea and vomiting.  Genitourinary:  Negative for frequency.  Musculoskeletal:  Positive for back pain.       Pain scale of her lower back is now 2-3.  Neurological:  Negative for dizziness, weakness and headaches.  Endo/Heme/Allergies:  Negative for polydipsia.       Objective:    Vitals BP 130/68   Pulse 97   Temp 98.3 F (36.8 C)   Resp 16   Ht 5\' 3"  (1.6 m)   Wt 170 lb 9.6 oz (77.4 kg)   SpO2 98%   BMI 30.22 kg/m    Physical Examination Physical Exam Constitutional:      Appearance: Normal appearance. She is not ill-appearing.  Cardiovascular:     Rate and Rhythm: Normal rate and regular rhythm.     Pulses: Normal pulses.     Heart sounds: No murmur heard.    No friction rub. No gallop.  Pulmonary:     Effort: Pulmonary effort is normal. No respiratory distress.     Breath sounds: No wheezing, rhonchi or rales.  Abdominal:     General: Bowel sounds are normal. There is no distension.     Palpations: Abdomen is soft.     Tenderness: There is no abdominal tenderness.  Musculoskeletal:     Right lower leg: No edema.     Left lower leg: No edema.  Skin:    General: Skin is warm and dry.     Findings: No rash.  Neurological:     General: No focal deficit present.     Mental  Status: She is alert and oriented to person, place, and time.  Psychiatric:        Mood and Affect: Mood normal.        Behavior: Behavior normal.        Assessment & Plan:   Inadequately controlled diabetes mellitus (HCC) She is now taking lantus 7 Units daily in addition to  her other meds.  We will check a HgBA1c today.  I want her to check her FSBS before she eats.  Lumbar radiculopathy, acute She has lumbar and cervical radiculopathy.  She has had an ESI and physical therapy that is helping a lot.  She goes back to see neurospine this week.    Return in about 3 months (around 01/09/2024).   Crist Fat, MD

## 2023-10-10 LAB — HEMOGLOBIN A1C
Est. average glucose Bld gHb Est-mCnc: 226 mg/dL
Hgb A1c MFr Bld: 9.5 % — ABNORMAL HIGH (ref 4.8–5.6)

## 2023-10-11 DIAGNOSIS — Z6830 Body mass index (BMI) 30.0-30.9, adult: Secondary | ICD-10-CM | POA: Diagnosis not present

## 2023-10-11 DIAGNOSIS — M4712 Other spondylosis with myelopathy, cervical region: Secondary | ICD-10-CM | POA: Diagnosis not present

## 2023-10-14 DIAGNOSIS — M4712 Other spondylosis with myelopathy, cervical region: Secondary | ICD-10-CM | POA: Diagnosis not present

## 2023-10-14 DIAGNOSIS — M545 Low back pain, unspecified: Secondary | ICD-10-CM | POA: Diagnosis not present

## 2023-10-14 DIAGNOSIS — M5416 Radiculopathy, lumbar region: Secondary | ICD-10-CM | POA: Diagnosis not present

## 2023-10-15 NOTE — Progress Notes (Signed)
Tell her that her diabetes is not controlled. Increase her lantus 7Units by increasing 3 Units every 3 days until her FSBS are consistently below 150. She needs to check her FSBS 2-3 times per day.  Patient is aware of lab results and directions

## 2023-10-18 ENCOUNTER — Ambulatory Visit: Payer: BC Managed Care – PPO | Admitting: Internal Medicine

## 2023-10-21 ENCOUNTER — Encounter: Payer: Self-pay | Admitting: Internal Medicine

## 2023-10-21 ENCOUNTER — Ambulatory Visit: Payer: BC Managed Care – PPO | Admitting: Internal Medicine

## 2023-10-21 VITALS — BP 124/78 | HR 74 | Temp 98.6°F | Resp 17 | Ht 63.0 in | Wt 172.8 lb

## 2023-10-21 DIAGNOSIS — E041 Nontoxic single thyroid nodule: Secondary | ICD-10-CM | POA: Diagnosis not present

## 2023-10-21 DIAGNOSIS — E1165 Type 2 diabetes mellitus with hyperglycemia: Secondary | ICD-10-CM

## 2023-10-21 NOTE — Assessment & Plan Note (Addendum)
We will obtain an [place] of her thryoid at this time.  We will check a Free TSH and Free T4.

## 2023-10-21 NOTE — Progress Notes (Signed)
Office Visit  Subjective   Patient ID: Cassandra Schmidt   DOB: 12-02-1965   Age: 57 y.o.   MRN: [id number]   Chief Complaint Chief Complaint  Patient presents with   Acute Visit    Medication Concerns     History of Present Illness The patient is a 57 year old Caucasian/White female who returns today for followup of her T2 diabetes.  On her last visit on 10/09/2023, her A1c was 9.5% where I asked her to increase her lantus by 3 Units every 3 days until her FSBS are consistently below 150.  She is currently on Lantus 10 Units  I saw her on 03/14/2023 and her HgBa1c was 8.1%.  At that time I started her on rybelsus as opposed to ozempic where she had GI side effects from ozempic (and mounjaro).  Over the interim, she had GI side effects from rybelsus with vomiting and diarrhea.  She stopped rybelsus on 10/09/2023.  Again, mounjaro and ozempic caused some abdominal pain.  We did refer her to GI who felt this was likely related to her IBS.  He wanted her to finish her trial of protonix BID then stop this medication and continue bentyl as needed and he added equilactin.  She remains on metformin XR 2000mg  daily, glipizide ER 10mg  BID, and Lantus 10 Units daily.   She denies any problems today.   She checks blood sugars at least once per day and they tend to range around 150-180.  Her last HgBa1c was done 2 weeks ago and was 9.5%.  She has no long term complications of diabetic nephropathy or retinopathy.  She has been followed for diabetic neuropathy of her hands and feet where we started her on gabapentin in 2017.  This past year her neuropathy pain was not controlled and we tried her on lyrica.  This did not help and we therefore switched her back to gabapentin.  She is currently on gabapentin from 800mg  BID.  We have tried her on Cymbalta 20mg  daily in the past.  She only took this for about 2 weeks but this causes some change in mood and she stopped it.   She was sent her for EMG/nerve conduction  testing which was done on 01/01/2022 and this study of her bilateral lower extremities was within normal limits except for isolated findings of mildly diminished sural sensory amplitudes which is a reflection of her peripheral neuropathy.  She did a yearly dilated diabetic eye exam by Dr. Dan Humphreys on 03/18/2023 and this showed no evidence of retinopathy.  Also of note, neurospine found incidental thyroid nodules on a MRI of her cervical spine for cervical spondylosis on 09/19/2023.  This showed degenerative changes of the cervical spine with moderate to severe spinal canal stenosis at C5-6 and moderate at C4-5 and C6-7.  Moderate bilateral neural foraminal narrowing at C6-7 and C7-T1.  Incidental thyroid nodules measuring up to 2.5 cm. She denies any heat or cold intolerance, no explained weight loss/gain, insomnia or other thyroid problems.       Past Medical History Past Medical History:  Diagnosis Date   Anxiety    Arthritis    Depression    Diabetes mellitus without complication (HCC)    Type II   Headache    Pneumonia    06/01/16- greater- than 5 years ago   Stroke [facility])    no residual effects     Allergies Allergies  Allergen Reactions   Keflex [Cephalexin] Anaphylaxis  Throat swelling   Bee Venom Swelling   Penicillins Itching, Rash and Other (See Comments)    Patient with history of ANAPHYLAXIS with ORAL CEPHALOSPORINS  Has patient had a PCN reaction causing immediate rash, facial/tongue/throat swelling, SOB or lightheadedness with hypotension: Yes Has patient had a PCN reaction causing severe rash involving mucus membranes or skin necrosis: No Has patient had a PCN reaction that required hospitalization No Has patient had a PCN reaction occurring within the last 10 years: No If all of the above answers are "NO", then may proceed with Cephalosporin use.     Medications  Current Outpatient Medications:    ACCU-CHEK AVIVA PLUS test strip, USE AS DIRECTED, Disp: 100 strip,  Rfl: 3   b complex vitamins tablet, Take 1 tablet by mouth daily., Disp: , Rfl:    buPROPion (WELLBUTRIN XL) 300 MG 24 hr tablet, Take 1 tablet (300 mg total) by mouth daily., Disp: 90 tablet, Rfl: 3   Cholecalciferol (VITAMIN D-3) 1000 units CAPS, Take 2 capsules by mouth daily. , Disp: , Rfl:    clonazePAM (KLONOPIN) 0.5 MG tablet, Take 1 tablet (0.5 mg total) by mouth at bedtime as needed for anxiety., Disp: 30 tablet, Rfl: 2   estradiol (ESTRACE) 1 MG tablet, Take 1 mg by mouth daily., Disp: , Rfl:    gabapentin (NEURONTIN) 800 MG tablet, Take 1 tablet (800 mg total) by mouth 2 (two) times daily., Disp: 180 tablet, Rfl: 3   glipiZIDE (GLUCOTROL) 10 MG tablet, TAKE 1 TABLET BY MOUTH TWICE A DAY BEFORE MEALS, Disp: 180 tablet, Rfl: 1   insulin glargine (LANTUS) 100 UNIT/ML injection, Inject 0.07 mLs (7 Units total) into the skin daily., Disp: 10 mL, Rfl: 11   Lancets (ONETOUCH DELICA PLUS LANCET33G) MISC, USE AS DIRECTED, Disp: 100 each, Rfl: 1   metFORMIN (GLUCOPHAGE-XR) 500 MG 24 hr tablet, Take 2 tablets (1,000 mg total) by mouth 2 (two) times daily with a meal., Disp: 360 tablet, Rfl: 5   Semaglutide (RYBELSUS) 7 MG TABS, Take 1 tablet (7 mg total) by mouth daily., Disp: 90 tablet, Rfl: 3   Review of Systems Review of Systems  Constitutional:  Negative for chills and fever.  Eyes:  Negative for blurred vision and double vision.  Respiratory:  Negative for shortness of breath.   Cardiovascular:  Negative for chest pain, palpitations and leg swelling.  Gastrointestinal:  Negative for abdominal pain, constipation, diarrhea, heartburn, nausea and vomiting.  Genitourinary:  Negative for frequency.  Musculoskeletal:  Negative for myalgias.  Skin:  Negative for itching and rash.  Neurological:  Negative for dizziness, weakness and headaches.  Endo/Heme/Allergies:  Negative for polydipsia.       Objective:    Vitals BP 124/78   Pulse 74   Temp 98.6 F (37 C)   Resp 17   Ht 5\' 3"   (1.6 m)   Wt 172 lb 12.8 oz (78.4 kg)   SpO2 96%   BMI 30.61 kg/m    Physical Examination Physical Exam Constitutional:      Appearance: Normal appearance. She is not ill-appearing.  Cardiovascular:     Rate and Rhythm: Normal rate and regular rhythm.     Pulses: Normal pulses.     Heart sounds: No murmur heard.    No friction rub. No gallop.  Pulmonary:     Effort: Pulmonary effort is normal. No respiratory distress.     Breath sounds: No wheezing, rhonchi or rales.  Abdominal:     General: Bowel  sounds are normal. There is no distension.     Palpations: Abdomen is soft.     Tenderness: There is no abdominal tenderness.  Musculoskeletal:     Right lower leg: No edema.     Left lower leg: No edema.  Skin:    General: Skin is warm and dry.     Findings: No rash.  Neurological:     Mental Status: She is alert.        Assessment & Plan:   Inadequately controlled diabetes mellitus (HCC) I have asked her to increase her lantus to 13 Units daily and continue her other meds.  Thyroid nodule We will obtain an [place] of her thryoid at this time.  We will check a Free TSH and Free T4.    No follow-ups on file.   Crist Fat, MD

## 2023-10-21 NOTE — Assessment & Plan Note (Signed)
I have asked her to increase her lantus to 13 Units daily and continue her other meds.

## 2023-10-22 ENCOUNTER — Other Ambulatory Visit: Payer: Self-pay

## 2023-10-22 LAB — TSH: TSH: 2.47 u[IU]/mL (ref 0.450–4.500)

## 2023-10-22 LAB — T4, FREE: Free T4: 1.21 ng/dL (ref 0.82–1.77)

## 2023-10-26 ENCOUNTER — Other Ambulatory Visit: Payer: Self-pay | Admitting: Internal Medicine

## 2023-10-28 DIAGNOSIS — M545 Low back pain, unspecified: Secondary | ICD-10-CM | POA: Diagnosis not present

## 2023-10-28 DIAGNOSIS — M5416 Radiculopathy, lumbar region: Secondary | ICD-10-CM | POA: Diagnosis not present

## 2023-10-28 DIAGNOSIS — E041 Nontoxic single thyroid nodule: Secondary | ICD-10-CM | POA: Diagnosis not present

## 2023-11-04 DIAGNOSIS — M545 Low back pain, unspecified: Secondary | ICD-10-CM | POA: Diagnosis not present

## 2023-11-04 DIAGNOSIS — M5416 Radiculopathy, lumbar region: Secondary | ICD-10-CM | POA: Diagnosis not present

## 2023-11-04 DIAGNOSIS — M4712 Other spondylosis with myelopathy, cervical region: Secondary | ICD-10-CM | POA: Diagnosis not present

## 2023-11-05 ENCOUNTER — Ambulatory Visit: Payer: BC Managed Care – PPO | Admitting: Internal Medicine

## 2023-11-05 ENCOUNTER — Encounter: Payer: Self-pay | Admitting: Internal Medicine

## 2023-11-05 ENCOUNTER — Other Ambulatory Visit: Payer: Self-pay | Admitting: Internal Medicine

## 2023-11-05 VITALS — BP 142/86 | HR 95 | Temp 98.7°F | Resp 18 | Ht 63.0 in | Wt 175.0 lb

## 2023-11-05 DIAGNOSIS — J020 Streptococcal pharyngitis: Secondary | ICD-10-CM | POA: Diagnosis not present

## 2023-11-05 MED ORDER — DOXYCYCLINE MONOHYDRATE 100 MG PO CAPS: 100.0000 mg | ORAL_CAPSULE | Freq: Two times a day (BID) | ORAL | 0 refills | Status: DC

## 2023-11-05 MED ORDER — ONETOUCH DELICA PLUS LANCET33G MISC: 2 refills | Status: AC

## 2023-11-05 NOTE — Progress Notes (Signed)
Office Visit  Subjective   Patient ID: Cassandra Schmidt   DOB: 1966-10-06   Age: 57 y.o.   MRN: [id number]   Chief Complaint Chief Complaint  Patient presents with   Acute Visit    Sore throat/cough     History of Present Illness Cassandra Schmidt is a 57 yo female who comes in today for an acute visit for pharyngitis.  She states that 3 days ago she began having an overwhelming need to drink fluids with a dry/full feeling in her throat.  She has had swelling of the back of her throat and also fatigue during this time.  She is having a dry nonproductive cough but denies any fevers, chills, sinus congestion, headache, post nasal drip, chest congestion, SOB, wheezing, myaglias, or nausea/vomiting or diarrhea.  She has not tried anything OTC.      Past Medical History Past Medical History:  Diagnosis Date   Anxiety    Arthritis    Depression    Diabetes mellitus without complication (HCC)    Type II   Headache    Pneumonia    06/01/16- greater- than 5 years ago   Stroke [facility])    no residual effects     Allergies Allergies  Allergen Reactions   Keflex [Cephalexin] Anaphylaxis    Throat swelling   Bee Venom Swelling   Penicillins Itching, Rash and Other (See Comments)    Patient with history of ANAPHYLAXIS with ORAL CEPHALOSPORINS  Has patient had a PCN reaction causing immediate rash, facial/tongue/throat swelling, SOB or lightheadedness with hypotension: Yes Has patient had a PCN reaction causing severe rash involving mucus membranes or skin necrosis: No Has patient had a PCN reaction that required hospitalization No Has patient had a PCN reaction occurring within the last 10 years: No If all of the above answers are "NO", then may proceed with Cephalosporin use.     Medications  Current Outpatient Medications:    doxycycline (MONODOX) 100 MG capsule, Take 1 capsule (100 mg total) by mouth 2 (two) times daily., Disp: 20 capsule, Rfl: 0   ACCU-CHEK AVIVA PLUS test strip,  USE AS DIRECTED, Disp: 100 strip, Rfl: 3   b complex vitamins tablet, Take 1 tablet by mouth daily., Disp: , Rfl:    BD PEN NEEDLE NANO 2ND GEN 32G X 4 MM MISC, USE INJECT INSULIN 7 TIMES DAILY, Disp: 100 each, Rfl: 1   buPROPion (WELLBUTRIN XL) 300 MG 24 hr tablet, Take 1 tablet (300 mg total) by mouth daily., Disp: 90 tablet, Rfl: 3   Cholecalciferol (VITAMIN D-3) 1000 units CAPS, Take 2 capsules by mouth daily. , Disp: , Rfl:    clonazePAM (KLONOPIN) 0.5 MG tablet, Take 1 tablet (0.5 mg total) by mouth at bedtime as needed for anxiety., Disp: 30 tablet, Rfl: 2   estradiol (ESTRACE) 1 MG tablet, Take 1 mg by mouth daily., Disp: , Rfl:    gabapentin (NEURONTIN) 800 MG tablet, Take 1 tablet (800 mg total) by mouth 2 (two) times daily., Disp: 180 tablet, Rfl: 3   glipiZIDE (GLUCOTROL) 10 MG tablet, TAKE 1 TABLET BY MOUTH TWICE A DAY BEFORE MEALS, Disp: 180 tablet, Rfl: 1   insulin glargine (LANTUS) 100 UNIT/ML injection, Inject 0.07 mLs (7 Units total) into the skin daily., Disp: 10 mL, Rfl: 11   Lancets (ONETOUCH DELICA PLUS LANCET33G) MISC, USE AS DIRECTED, Disp: 100 each, Rfl: 1   metFORMIN (GLUCOPHAGE-XR) 500 MG 24 hr tablet, Take 2 tablets (1,000 mg total) by  mouth 2 (two) times daily with a meal., Disp: 360 tablet, Rfl: 5   Semaglutide (RYBELSUS) 7 MG TABS, Take 1 tablet (7 mg total) by mouth daily., Disp: 90 tablet, Rfl: 3   Review of Systems Review of Systems  Constitutional:  Negative for chills and fever.  HENT:  Negative for congestion, sinus pain and sore throat.   Respiratory:  Positive for cough. Negative for sputum production, shortness of breath and wheezing.   Gastrointestinal:  Negative for abdominal pain, constipation, diarrhea, nausea and vomiting.  Genitourinary:  Positive for frequency.  Musculoskeletal:  Negative for myalgias.  Skin:  Negative for rash.  Neurological:  Negative for dizziness, weakness and headaches.       Objective:    Vitals BP (!) 142/86   Pulse  95   Temp 98.7 F (37.1 C)   Resp 18   Ht 5\' 3"  (1.6 m)   Wt 175 lb (79.4 kg)   SpO2 (!) 17%   BMI 31.00 kg/m    Physical Examination Physical Exam Constitutional:      Appearance: Normal appearance. She is not ill-appearing.  HENT:     Right Ear: Tympanic membrane, ear canal and external ear normal.     Left Ear: Tympanic membrane, ear canal and external ear normal.     Nose: Nose normal. No congestion or rhinorrhea.     Mouth/Throat:     Mouth: Mucous membranes are moist.     Pharynx: Posterior oropharyngeal erythema present. No oropharyngeal exudate.  Cardiovascular:     Rate and Rhythm: Normal rate and regular rhythm.     Pulses: Normal pulses.     Heart sounds: No murmur heard.    No friction rub. No gallop.  Pulmonary:     Effort: Pulmonary effort is normal. No respiratory distress.     Breath sounds: No wheezing, rhonchi or rales.  Abdominal:     General: Bowel sounds are normal. There is no distension.     Palpations: Abdomen is soft.     Tenderness: There is no abdominal tenderness.  Musculoskeletal:     Right lower leg: No edema.     Left lower leg: No edema.  Skin:    General: Skin is warm and dry.     Findings: No rash.  Neurological:     Mental Status: She is alert.        Assessment & Plan:   Strep throat Her COVID-19 testing was negative but her rapid strep test was positive.  We will treat her for strep throat at this time.      No follow-ups on file.   Crist Fat, MD

## 2023-11-05 NOTE — Assessment & Plan Note (Signed)
Her COVID-19 testing was negative but her rapid strep test was positive.  We will treat her for strep throat at this time.

## 2023-11-14 DIAGNOSIS — M5416 Radiculopathy, lumbar region: Secondary | ICD-10-CM | POA: Diagnosis not present

## 2023-11-14 DIAGNOSIS — M4712 Other spondylosis with myelopathy, cervical region: Secondary | ICD-10-CM | POA: Diagnosis not present

## 2023-11-14 DIAGNOSIS — M545 Low back pain, unspecified: Secondary | ICD-10-CM | POA: Diagnosis not present

## 2023-12-06 DIAGNOSIS — Z6832 Body mass index (BMI) 32.0-32.9, adult: Secondary | ICD-10-CM | POA: Diagnosis not present

## 2023-12-06 DIAGNOSIS — M5416 Radiculopathy, lumbar region: Secondary | ICD-10-CM | POA: Diagnosis not present

## 2023-12-06 DIAGNOSIS — M4712 Other spondylosis with myelopathy, cervical region: Secondary | ICD-10-CM | POA: Diagnosis not present

## 2023-12-08 ENCOUNTER — Other Ambulatory Visit: Payer: Self-pay | Admitting: Internal Medicine

## 2023-12-16 DIAGNOSIS — E042 Nontoxic multinodular goiter: Secondary | ICD-10-CM | POA: Diagnosis not present

## 2023-12-17 ENCOUNTER — Encounter: Payer: Self-pay | Admitting: Internal Medicine

## 2023-12-23 ENCOUNTER — Ambulatory Visit: Payer: BC Managed Care – PPO | Admitting: Internal Medicine

## 2023-12-23 ENCOUNTER — Encounter: Payer: Self-pay | Admitting: Internal Medicine

## 2023-12-23 VITALS — BP 148/84 | HR 101 | Temp 98.4°F | Resp 17 | Ht 63.0 in | Wt 180.0 lb

## 2023-12-23 DIAGNOSIS — M5416 Radiculopathy, lumbar region: Secondary | ICD-10-CM | POA: Diagnosis not present

## 2023-12-23 DIAGNOSIS — R051 Acute cough: Secondary | ICD-10-CM | POA: Insufficient documentation

## 2023-12-23 DIAGNOSIS — E041 Nontoxic single thyroid nodule: Secondary | ICD-10-CM | POA: Diagnosis not present

## 2023-12-23 NOTE — Progress Notes (Signed)
Office Visit  Subjective   Patient ID: Cassandra Schmidt   DOB: 1966/01/26   Age: 58 y.o.   MRN: [id number]   Chief Complaint No chief complaint on file.    History of Present Illness Cassandra Schmidt is a 58 yo female who comes in today with compalints of cough which started 3 days ago.  She denies any sinus congestion, rhinorrhea, post nasal drip, headaches. myaglias, chest congestion, wheezing, nausea, vomiting or diarrhea.  This is a non-productive cough with some mild SOB.  She has not tried anything OTC as of yet.     Past Medical History Past Medical History:  Diagnosis Date   Anxiety    Arthritis    Depression    Diabetes mellitus without complication (HCC)    Type II   Headache    Pneumonia    06/01/16- greater- than 5 years ago   Stroke Cassandra Schmidt)    no residual effects     Allergies Allergies  Allergen Reactions   Keflex [Cephalexin] Anaphylaxis    Throat swelling   Bee Venom Swelling   Penicillins Itching, Rash and Other (See Comments)    Patient with history of ANAPHYLAXIS with ORAL CEPHALOSPORINS  Has patient had a PCN reaction causing immediate rash, facial/tongue/throat swelling, SOB or lightheadedness with hypotension: Yes Has patient had a PCN reaction causing severe rash involving mucus membranes or skin necrosis: No Has patient had a PCN reaction that required hospitalization No Has patient had a PCN reaction occurring within the last 10 years: No If all of the above answers are "NO", then may proceed with Cephalosporin use.     Medications  Current Outpatient Medications:    ACCU-CHEK AVIVA PLUS test strip, USE AS DIRECTED, Disp: 100 strip, Rfl: 3   b complex vitamins tablet, Take 1 tablet by mouth daily., Disp: , Rfl:    BD PEN NEEDLE NANO 2ND GEN 32G X 4 MM MISC, USE INJECT INSULIN 7 TIMES DAILY, Disp: 100 each, Rfl: 1   buPROPion (WELLBUTRIN XL) 300 MG 24 hr tablet, Take 1 tablet (300 mg total) by mouth daily., Disp: 90 tablet, Rfl: 3    Cholecalciferol (VITAMIN D-3) 1000 units CAPS, Take 2 capsules by mouth daily. , Disp: , Rfl:    clonazePAM (KLONOPIN) 0.5 MG tablet, Take 1 tablet (0.5 mg total) by mouth at bedtime as needed for anxiety., Disp: 30 tablet, Rfl: 2   estradiol (ESTRACE) 1 MG tablet, Take 1 mg by mouth daily., Disp: , Rfl:    gabapentin (NEURONTIN) 800 MG tablet, Take 1 tablet (800 mg total) by mouth 2 (two) times daily., Disp: 180 tablet, Rfl: 3   glipiZIDE (GLUCOTROL) 10 MG tablet, TAKE 1 TABLET BY MOUTH TWICE A DAY BEFORE MEALS, Disp: 180 tablet, Rfl: 1   insulin glargine (LANTUS) 100 UNIT/ML injection, Inject 0.07 mLs (7 Units total) into the skin daily., Disp: 10 mL, Rfl: 11   Lancets (ONETOUCH DELICA PLUS LANCET33G) MISC, Check FSBS 3-4 times per day, Disp: 100 each, Rfl: 2   metFORMIN (GLUCOPHAGE-XR) 500 MG 24 hr tablet, Take 2 tablets (1,000 mg total) by mouth 2 (two) times daily with a meal., Disp: 360 tablet, Rfl: 5   Semaglutide (RYBELSUS) 7 MG TABS, Take 1 tablet (7 mg total) by mouth daily., Disp: 90 tablet, Rfl: 3   Review of Systems Review of Systems  Constitutional:  Negative for chills and fever.  Neurological:  Negative for dizziness, weakness and headaches.       Objective:  Vitals BP (!) 148/84   Pulse (!) 101   Temp 98.4 F (36.9 C)   Resp 17   Ht 5\' 3"  (1.6 m)   Wt 180 lb (81.6 kg)   SpO2 97%   BMI 31.89 kg/m    Physical Examination Physical Exam Constitutional:      Appearance: Normal appearance. She is not ill-appearing.  HENT:     Right Ear: Tympanic membrane, ear canal and external ear normal.     Left Ear: Tympanic membrane, ear canal and external ear normal.     Nose: Nose normal. No congestion or rhinorrhea.     Mouth/Throat:     Mouth: Mucous membranes are moist.     Pharynx: Oropharynx is clear. No oropharyngeal exudate or posterior oropharyngeal erythema.  Cardiovascular:     Rate and Rhythm: Normal rate and regular rhythm.     Pulses: Normal pulses.      Heart sounds: No murmur heard.    No friction rub. No gallop.  Pulmonary:     Effort: Pulmonary effort is normal. No respiratory distress.     Breath sounds: No wheezing, rhonchi or rales.  Abdominal:     General: Bowel sounds are normal. There is no distension.     Palpations: Abdomen is soft.     Tenderness: There is no abdominal tenderness.  Musculoskeletal:     Right lower leg: No edema.     Left lower leg: No edema.  Skin:    General: Skin is warm and dry.     Findings: No rash.  Neurological:     Mental Status: She is alert.        Assessment & Plan:   Thyroid nodule We referred her for an US guided biopsy of these thyroid nodules but she has not heard back from [facility].  We will see if we can get these done in [place].  Acute cough Her physical exam shows her ears and her pharynx are normal.  I do not think she has allergies and the rest of her exam is normal.  She states she is feeling better.  We tested her for COVID-19 and flu which were negative.  Continue supportive care.    No follow-ups on file.   Crist Fat, MD

## 2023-12-23 NOTE — Assessment & Plan Note (Signed)
We referred her for an US guided biopsy of these thyroid nodules but she has not heard back from [facility].  We will see if we can get these done in [place].

## 2023-12-23 NOTE — Assessment & Plan Note (Signed)
Her physical exam shows her ears and her pharynx are normal.  I do not think she has allergies and the rest of her exam is normal.  She states she is feeling better.  We tested her for COVID-19 and flu which were negative.  Continue supportive care.

## 2023-12-31 ENCOUNTER — Other Ambulatory Visit: Payer: Self-pay | Admitting: Internal Medicine

## 2023-12-31 DIAGNOSIS — Z0000 Encounter for general adult medical examination without abnormal findings: Secondary | ICD-10-CM

## 2024-01-05 ENCOUNTER — Other Ambulatory Visit: Payer: Self-pay | Admitting: Internal Medicine

## 2024-01-05 DIAGNOSIS — Z0000 Encounter for general adult medical examination without abnormal findings: Secondary | ICD-10-CM

## 2024-01-05 MED ORDER — CLONAZEPAM 0.5 MG PO TABS: 0.5000 mg | ORAL_TABLET | Freq: Every evening | ORAL | 2 refills | Status: DC | PRN

## 2024-01-06 ENCOUNTER — Ambulatory Visit: Payer: BC Managed Care – PPO | Admitting: Internal Medicine

## 2024-01-10 ENCOUNTER — Other Ambulatory Visit: Payer: Self-pay | Admitting: Internal Medicine

## 2024-01-10 DIAGNOSIS — E041 Nontoxic single thyroid nodule: Secondary | ICD-10-CM

## 2024-01-13 ENCOUNTER — Other Ambulatory Visit: Payer: Self-pay | Admitting: Internal Medicine

## 2024-01-13 DIAGNOSIS — E041 Nontoxic single thyroid nodule: Secondary | ICD-10-CM

## 2024-01-14 ENCOUNTER — Telehealth: Payer: Self-pay | Admitting: Internal Medicine

## 2024-01-14 ENCOUNTER — Encounter: Payer: Self-pay | Admitting: Internal Medicine

## 2024-01-14 NOTE — Telephone Encounter (Signed)
 [facility] called to let us know the pt has rescheduled her Thyroid US for April 7th @ 10:30am.

## 2024-02-04 ENCOUNTER — Ambulatory Visit
Admission: RE | Admit: 2024-02-04 | Discharge: 2024-02-04 | Disposition: A | Discharge status: Home / Self Care | Source: Ambulatory Visit | Attending: Internal Medicine | Admitting: Internal Medicine

## 2024-02-04 ENCOUNTER — Other Ambulatory Visit (HOSPITAL_COMMUNITY)
Admission: RE | Admit: 2024-02-04 | Discharge: 2024-02-04 | Disposition: A | Discharge status: Home / Self Care | Source: Ambulatory Visit | Attending: Interventional Radiology | Admitting: Interventional Radiology

## 2024-02-04 DIAGNOSIS — E041 Nontoxic single thyroid nodule: Secondary | ICD-10-CM | POA: Diagnosis not present

## 2024-02-04 DIAGNOSIS — E042 Nontoxic multinodular goiter: Secondary | ICD-10-CM | POA: Diagnosis not present

## 2024-02-05 DIAGNOSIS — Z6831 Body mass index (BMI) 31.0-31.9, adult: Secondary | ICD-10-CM | POA: Diagnosis not present

## 2024-02-05 DIAGNOSIS — M4712 Other spondylosis with myelopathy, cervical region: Secondary | ICD-10-CM | POA: Diagnosis not present

## 2024-02-05 LAB — CYTOLOGY - NON PAP

## 2024-02-14 NOTE — Progress Notes (Signed)
 Tell her that her pathology is back and is benign. We will follow her thyroid nodules and her thyroid functions.  Patient is aware of labs

## 2024-03-22 ENCOUNTER — Other Ambulatory Visit: Payer: Self-pay | Admitting: Internal Medicine

## 2024-03-25 ENCOUNTER — Ambulatory Visit: Admitting: Internal Medicine

## 2024-03-25 ENCOUNTER — Encounter: Payer: Self-pay | Admitting: Internal Medicine

## 2024-03-25 VITALS — BP 154/76 | HR 98 | Temp 97.4°F | Resp 18 | Ht 63.0 in | Wt 179.4 lb

## 2024-03-25 DIAGNOSIS — E1165 Type 2 diabetes mellitus with hyperglycemia: Secondary | ICD-10-CM | POA: Diagnosis not present

## 2024-03-25 DIAGNOSIS — E1142 Type 2 diabetes mellitus with diabetic polyneuropathy: Secondary | ICD-10-CM

## 2024-03-25 MED ORDER — INSULIN GLARGINE 100 UNIT/ML SOLOSTAR PEN: 30.0000 [IU] | PEN_INJECTOR | Freq: Every day | SUBCUTANEOUS | 3 refills | Status: DC

## 2024-03-25 NOTE — Assessment & Plan Note (Signed)
 We will rewrite her lantus  for 30 Units daily.  Her diabetes has been controlled.  I will check her HgBa1c today.

## 2024-03-25 NOTE — Progress Notes (Signed)
 Office Visit  Subjective   Patient ID: Cassandra Schmidt   DOB: 05-Dec-1965   Age: 58 y.o.   MRN: [id number]   Chief Complaint Chief Complaint  Patient presents with   Follow-up    Med Change     History of Present Illness The patient is a 58 year old Caucasian/White female who returns today for followup of her T2 diabetes.  On her last visit on 10/09/2023, her A1c was 9.5% where I asked her to increase her lantus  by 3 Units every 3 days until her FSBS are consistently below 150.  She is currently on Lantus  30 Units daily where we asked her in 09/2023 to uptitrate as described.   I saw her on 03/14/2023 and her HgBa1c was 8.1%.  At that time I started her on rybelsus  as opposed to ozempic where she had GI side effects from ozempic (and mounjaro ).  Over the interim, she had GI side effects from rybelsus  with vomiting and diarrhea.  She stopped rybelsus  on 10/09/2023.  Again, mounjaro  and ozempic caused some abdominal pain.  We did refer her to GI who felt this was likely related to her IBS.  He wanted her to finish her trial of protonix BID then stop this medication and continue bentyl as needed and he added equilactin.  She remains on metformin  XR 2000mg  daily, glipizide  ER 10mg  BID, and Lantus  30 Units daily.   She denies any problems today.   She checks blood sugars at least once per day and they tend to range around 90-120.  Her last HgBa1c was done in 09/2023 and was 9.5%.  She has no long term complications of diabetic nephropathy or retinopathy.  She has been followed for diabetic neuropathy of her hands and feet where we started her on gabapentin  in 2017.  This past year her neuropathy pain was not controlled and we tried her on lyrica.  This did not help and we therefore switched her back to gabapentin .  She is currently on gabapentin  from 800mg  BID.  We have tried her on Cymbalta 20mg  daily in the past.  She only took this for about 2 weeks but this causes some change in mood and she stopped  it.   She was sent her for EMG/nerve conduction testing which was done on 01/01/2022 and this study of her bilateral lower extremities was within normal limits except for isolated findings of mildly diminished sural sensory amplitudes which is a reflection of her peripheral neuropathy.  She did a yearly dilated diabetic eye exam by Dr. Otho Blitz on 03/19/2024 and this showed no evidence of retinopathy.     Past Medical History Past Medical History:  Diagnosis Date   Anxiety    Arthritis    Depression    Diabetes mellitus without complication (HCC)    Type II   Headache    Pneumonia    06/01/16- greater- than 5 years ago   Stroke [facility])    no residual effects     Allergies Allergies  Allergen Reactions   Keflex [Cephalexin] Anaphylaxis    Throat swelling   Bee Venom Swelling   Penicillins Itching, Rash and Other (See Comments)    Patient with history of ANAPHYLAXIS with ORAL CEPHALOSPORINS  Has patient had a PCN reaction causing immediate rash, facial/tongue/throat swelling, SOB or lightheadedness with hypotension: Yes Has patient had a PCN reaction causing severe rash involving mucus membranes or skin necrosis: No Has patient had a PCN reaction that required hospitalization No Has  patient had a PCN reaction occurring within the last 10 years: No If all of the above answers are "NO", then may proceed with Cephalosporin use.     Medications  Current Outpatient Medications:    ACCU-CHEK AVIVA PLUS test strip, USE AS DIRECTED, Disp: 100 strip, Rfl: 3   b complex vitamins tablet, Take 1 tablet by mouth daily., Disp: , Rfl:    BD PEN NEEDLE NANO 2ND GEN 32G X 4 MM MISC, USE INJECT INSULIN  7 TIMES DAILY, Disp: 100 each, Rfl: 1   buPROPion  (WELLBUTRIN  XL) 300 MG 24 hr tablet, Take 1 tablet (300 mg total) by mouth daily., Disp: 90 tablet, Rfl: 3   Cholecalciferol  (VITAMIN D -3) 1000 units CAPS, Take 2 capsules by mouth daily. , Disp: , Rfl:    estradiol (ESTRACE) 1 MG tablet, Take 1 mg by  mouth daily., Disp: , Rfl:    gabapentin  (NEURONTIN ) 800 MG tablet, Take 1 tablet (800 mg total) by mouth 2 (two) times daily., Disp: 180 tablet, Rfl: 3   glipiZIDE  (GLUCOTROL ) 10 MG tablet, TAKE 1 TABLET BY MOUTH TWICE A DAY BEFORE MEALS, Disp: 180 tablet, Rfl: 1   insulin  glargine (LANTUS ) 100 UNIT/ML injection, Inject 0.07 mLs (7 Units total) into the skin daily., Disp: 10 mL, Rfl: 11   Lancets (ONETOUCH DELICA PLUS LANCET33G) MISC, Check FSBS 3-4 times per day, Disp: 100 each, Rfl: 2   metFORMIN  (GLUCOPHAGE -XR) 500 MG 24 hr tablet, Take 2 tablets (1,000 mg total) by mouth 2 (two) times daily with a meal., Disp: 360 tablet, Rfl: 5   Review of Systems Review of Systems  Constitutional:  Negative for chills, fever and malaise/fatigue.  Eyes:  Positive for blurred vision.  Respiratory:  Negative for shortness of breath.   Cardiovascular:  Negative for chest pain, palpitations and leg swelling.  Gastrointestinal:  Negative for abdominal pain, constipation, diarrhea, nausea and vomiting.  Genitourinary:  Negative for frequency.  Musculoskeletal:  Negative for myalgias.  Neurological:  Negative for dizziness, weakness and headaches.  Endo/Heme/Allergies:  Negative for polydipsia.       Objective:    Vitals BP (!) 154/76   Pulse 98   Temp (!) 97.4 F (36.3 C) (Temporal)   Resp 18   Ht 5\' 3"  (1.6 m)   Wt 179 lb 6.4 oz (81.4 kg)   SpO2 96%   BMI 31.78 kg/m    Physical Examination Physical Exam Constitutional:      Appearance: Normal appearance. She is not ill-appearing.  Cardiovascular:     Rate and Rhythm: Normal rate and regular rhythm.     Pulses: Normal pulses.     Heart sounds: No murmur heard.    No friction rub. No gallop.  Pulmonary:     Effort: Pulmonary effort is normal. No respiratory distress.     Breath sounds: No wheezing, rhonchi or rales.  Abdominal:     General: Bowel sounds are normal. There is no distension.     Palpations: Abdomen is soft.      Tenderness: There is no abdominal tenderness.  Musculoskeletal:     Right lower leg: No edema.     Left lower leg: No edema.  Skin:    General: Skin is warm and dry.     Findings: No rash.  Neurological:     Mental Status: She is alert.        Assessment & Plan:   Diabetic polyneuropathy associated with type 2 diabetes mellitus (HCC) We will rewrite her lantus   for 30 Units daily.  Her diabetes has been controlled.  I will check her HgBa1c today.    Return in about 3 months (around 06/24/2024).   Wayne Haines, MD

## 2024-03-26 LAB — HEMOGLOBIN A1C
Est. average glucose Bld gHb Est-mCnc: 232 mg/dL
Hgb A1c MFr Bld: 9.7 % — ABNORMAL HIGH (ref 4.8–5.6)

## 2024-03-30 NOTE — Progress Notes (Signed)
 Increase her lantus  to 34 Units daily.  Patient aware

## 2024-04-15 DIAGNOSIS — Z01419 Encounter for gynecological examination (general) (routine) without abnormal findings: Secondary | ICD-10-CM | POA: Diagnosis not present

## 2024-04-15 DIAGNOSIS — Z1231 Encounter for screening mammogram for malignant neoplasm of breast: Secondary | ICD-10-CM | POA: Diagnosis not present

## 2024-04-15 DIAGNOSIS — Z124 Encounter for screening for malignant neoplasm of cervix: Secondary | ICD-10-CM | POA: Diagnosis not present

## 2024-05-09 ENCOUNTER — Other Ambulatory Visit: Payer: Self-pay | Admitting: Internal Medicine

## 2024-05-15 ENCOUNTER — Other Ambulatory Visit: Payer: Self-pay

## 2024-05-15 ENCOUNTER — Other Ambulatory Visit: Payer: Self-pay | Admitting: Internal Medicine

## 2024-05-15 DIAGNOSIS — Z0000 Encounter for general adult medical examination without abnormal findings: Secondary | ICD-10-CM

## 2024-05-15 MED ORDER — REZVOGLAR KWIKPEN 100 UNIT/ML ~~LOC~~ SOPN: 100.0000 [IU] | PEN_INJECTOR | SUBCUTANEOUS | 0 refills | Status: DC

## 2024-05-15 NOTE — Progress Notes (Signed)
 New Rx per [facility]

## 2024-06-29 ENCOUNTER — Ambulatory Visit: Admitting: Internal Medicine

## 2024-07-08 ENCOUNTER — Ambulatory Visit: Admitting: Internal Medicine

## 2024-07-08 ENCOUNTER — Encounter: Payer: Self-pay | Admitting: Internal Medicine

## 2024-07-08 VITALS — BP 120/70 | HR 104 | Temp 98.0°F | Resp 18 | Ht 63.0 in | Wt 176.1 lb

## 2024-07-08 DIAGNOSIS — E1142 Type 2 diabetes mellitus with diabetic polyneuropathy: Secondary | ICD-10-CM

## 2024-07-08 DIAGNOSIS — E1165 Type 2 diabetes mellitus with hyperglycemia: Secondary | ICD-10-CM | POA: Diagnosis not present

## 2024-07-08 LAB — HEMOGLOBIN A1C
Est. average glucose Bld gHb Est-mCnc: 214 mg/dL
Hgb A1c MFr Bld: 9.1 % — ABNORMAL HIGH (ref 4.8–5.6)

## 2024-07-08 NOTE — Progress Notes (Signed)
 Office Visit  Subjective   Patient ID: Cassandra Schmidt   DOB: 09-03-1966   Age: 58 y.o.   MRN: [id number]   Chief Complaint Chief Complaint  Patient presents with   Diabetic Polyneuropathy    3 month follow up     History of Present Illness The patient is a 58 year old Caucasian/White female who returns today for followup of her T2 diabetes.  On her last visit, her A1c was elevated and I asked her to increase her lantus  to 34 Units daily.  She is currently on Lantus  30 Units daily (she did not go to 34 Units).   I saw her on 03/14/2023 and her HgBa1c was 8.1%.  At that time I started her on rybelsus  as opposed to ozempic where she had GI side effects from ozempic (and mounjaro ).  She also had GI side effects from rybelsus  with vomiting and diarrhea.  She stopped rybelsus  on 10/09/2023.  Again, mounjaro  and ozempic caused some abdominal pain.  We did refer her to GI who felt this was likely related to her IBS.  He wanted her to finish her trial of protonix BID then stop this medication and continue bentyl as needed and he added equilactin.  She remains on metformin  XR 2000mg  daily, glipizide  ER 10mg  BID, and Lantus  30 Units daily.   She denies any problems today.   She checks blood sugars at least once per day and they tend to range around 80-160.  Her last HgBa1c was done 3 months ago and was 9.7%.  She has no long term complications of diabetic nephropathy or retinopathy.  She has been followed for diabetic neuropathy of her hands and feet where we started her on gabapentin  in 2017.  This past year her neuropathy pain was not controlled and we tried her on lyrica.  This did not help and we therefore switched her back to gabapentin .  She is currently on gabapentin  from 800mg  BID.  We have tried her on Cymbalta 20mg  daily in the past.  She only took this for about 2 weeks but this causes some change in mood and she stopped it.   She was sent her for EMG/nerve conduction testing which was done on  01/01/2022 and this study of her bilateral lower extremities was within normal limits except for isolated findings of mildly diminished sural sensory amplitudes which is a reflection of her peripheral neuropathy.  She did a yearly dilated diabetic eye exam by Dr. Vannie on 03/19/2024 and this showed no evidence of retinopathy.     Past Medical History Past Medical History:  Diagnosis Date   Anxiety    Arthritis    Depression    Diabetes mellitus without complication (HCC)    Type II   Headache    Pneumonia    06/01/16- greater- than 5 years ago   Stroke [facility])    no residual effects     Allergies Allergies  Allergen Reactions   Keflex [Cephalexin] Anaphylaxis    Throat swelling   Bee Venom Swelling   Penicillins Itching, Rash and Other (See Comments)    Patient with history of ANAPHYLAXIS with ORAL CEPHALOSPORINS  Has patient had a PCN reaction causing immediate rash, facial/tongue/throat swelling, SOB or lightheadedness with hypotension: Yes Has patient had a PCN reaction causing severe rash involving mucus membranes or skin necrosis: No Has patient had a PCN reaction that required hospitalization No Has patient had a PCN reaction occurring within the last 10 years: No  If all of the above answers are NO, then may proceed with Cephalosporin use.     Medications  Current Outpatient Medications:    ACCU-CHEK AVIVA PLUS test strip, USE AS DIRECTED, Disp: 100 strip, Rfl: 3   b complex vitamins tablet, Take 1 tablet by mouth daily., Disp: , Rfl:    BD PEN NEEDLE NANO 2ND GEN 32G X 4 MM MISC, USE INJECT INSULIN  7 TIMES DAILY, Disp: 100 each, Rfl: 1   buPROPion  (WELLBUTRIN  XL) 300 MG 24 hr tablet, TAKE 1 TABLET BY MOUTH EVERY DAY, Disp: 90 tablet, Rfl: 3   Cholecalciferol  (VITAMIN D -3) 1000 units CAPS, Take 2 capsules by mouth daily. , Disp: , Rfl:    estradiol (ESTRACE) 1 MG tablet, Take 1 mg by mouth daily., Disp: , Rfl:    gabapentin  (NEURONTIN ) 800 MG tablet, Take 1 tablet (800 mg  total) by mouth 2 (two) times daily., Disp: 180 tablet, Rfl: 3   glipiZIDE  (GLUCOTROL ) 10 MG tablet, TAKE 1 TABLET BY MOUTH TWICE A DAY BEFORE MEALS, Disp: 180 tablet, Rfl: 1   insulin  glargine (LANTUS ) 100 UNIT/ML injection, Inject 0.07 mLs (7 Units total) into the skin daily., Disp: 10 mL, Rfl: 11   insulin  glargine (LANTUS ) 100 UNIT/ML Solostar Pen, Inject 30 Units into the skin daily., Disp: 27 mL, Rfl: 3   Insulin  Glargine-aglr (REZVOGLAR  KWIKPEN) 100 UNIT/ML SOPN, Inject 100 Units into the skin as directed., Disp: 1 mL, Rfl: 0   Lancets (ONETOUCH DELICA PLUS LANCET33G) MISC, Check FSBS 3-4 times per day, Disp: 100 each, Rfl: 2   metFORMIN  (GLUCOPHAGE -XR) 500 MG 24 hr tablet, Take 2 tablets (1,000 mg total) by mouth 2 (two) times daily with a meal., Disp: 360 tablet, Rfl: 5   Review of Systems Review of Systems  Constitutional:  Negative for chills and fever.  Eyes:  Negative for blurred vision.  Respiratory:  Negative for shortness of breath.   Cardiovascular:  Negative for chest pain and palpitations.  Gastrointestinal:  Negative for abdominal pain, constipation, diarrhea, nausea and vomiting.  Genitourinary:  Negative for frequency.  Musculoskeletal:  Positive for myalgias.  Neurological:  Negative for dizziness, weakness and headaches.  Endo/Heme/Allergies:  Negative for polydipsia.       Objective:    Vitals BP 120/70 (BP Location: Left Arm, Patient Position: Sitting, Cuff Size: Normal)   Pulse (!) 104   Temp 98 F (36.7 C)   Resp 18   Ht 5' 3 (1.6 m)   Wt 176 lb 2 oz (79.9 kg)   SpO2 93%   BMI 31.20 kg/m    Physical Examination Physical Exam Constitutional:      Appearance: Normal appearance. She is not ill-appearing.  Cardiovascular:     Rate and Rhythm: Normal rate and regular rhythm.     Pulses: Normal pulses.     Heart sounds: No murmur heard.    No friction rub. No gallop.  Pulmonary:     Effort: Pulmonary effort is normal. No respiratory distress.      Breath sounds: No wheezing, rhonchi or rales.  Abdominal:     General: Bowel sounds are normal. There is no distension.     Palpations: Abdomen is soft.     Tenderness: There is no abdominal tenderness.  Musculoskeletal:     Right lower leg: No edema.     Left lower leg: No edema.  Skin:    General: Skin is warm and dry.     Findings: No rash.  Neurological:  Mental Status: She is alert.        Assessment & Plan:   Diabetic polyneuropathy associated with type 2 diabetes mellitus (HCC) We will check her HgBa1c and adjust her insulin  as needed.  She saw Dr. Rox in GYN who has been on adipex since 03/2024.  We will continue to monitor her weight.    Return in about 3 months (around 10/08/2024) for annual.   Selinda Fleeta Finger, MD

## 2024-07-08 NOTE — Assessment & Plan Note (Signed)
 We will check her HgBa1c and adjust her insulin  as needed.  She saw Dr. Rox in GYN who has been on adipex since 03/2024.  We will continue to monitor her weight.

## 2024-07-15 ENCOUNTER — Ambulatory Visit: Payer: Self-pay

## 2024-07-15 NOTE — Progress Notes (Signed)
 Patient called.  Left message for patient to call back.  I have called and left the patient a voicemail to return our phone call. The patient needs to be informed Tell her to increase her lantus  to 34 Units daily.  Call her in a new prescrption for the lantus  solostar pen, give her 4 pens per month. SABRA

## 2024-07-20 NOTE — Progress Notes (Signed)
 Patient called.  Patient aware.  I have called and informed the patient  Tell her to increase her lantus  to 34 Units daily.  Call her in a new prescrption for the lantus  solostar pen, give her 4 pens per month. .  Pt aware, reports she has enough pens and does not need a refill at this time.

## 2024-07-31 ENCOUNTER — Other Ambulatory Visit: Payer: Self-pay | Admitting: Internal Medicine

## 2024-08-29 ENCOUNTER — Other Ambulatory Visit: Payer: Self-pay | Admitting: Internal Medicine

## 2024-08-29 DIAGNOSIS — Z0000 Encounter for general adult medical examination without abnormal findings: Secondary | ICD-10-CM

## 2024-10-07 ENCOUNTER — Other Ambulatory Visit: Payer: Self-pay | Admitting: Internal Medicine

## 2024-10-15 ENCOUNTER — Ambulatory Visit: Admitting: Internal Medicine

## 2024-10-15 ENCOUNTER — Encounter: Payer: Self-pay | Admitting: Internal Medicine

## 2024-10-15 VITALS — BP 148/78 | HR 117 | Temp 97.8°F | Resp 16 | Ht 63.0 in | Wt 179.0 lb

## 2024-10-15 DIAGNOSIS — E66811 Obesity, class 1: Secondary | ICD-10-CM

## 2024-10-15 DIAGNOSIS — E1165 Type 2 diabetes mellitus with hyperglycemia: Secondary | ICD-10-CM | POA: Diagnosis not present

## 2024-10-15 DIAGNOSIS — Z6831 Body mass index (BMI) 31.0-31.9, adult: Secondary | ICD-10-CM | POA: Diagnosis not present

## 2024-10-15 DIAGNOSIS — E6609 Other obesity due to excess calories: Secondary | ICD-10-CM

## 2024-10-15 MED ORDER — PHENTERMINE HCL 37.5 MG PO TABS
37.5000 mg | ORAL_TABLET | Freq: Every day | ORAL | 2 refills | Status: AC
Start: 2024-10-15 — End: ?

## 2024-10-15 NOTE — Assessment & Plan Note (Signed)
 Plan as below.

## 2024-10-15 NOTE — Assessment & Plan Note (Signed)
 Dr. Wein had started her on adipex several months ago but the patient ran out about 2 months ago.  We will restart her on this and I want her to eat healthy and exercise.  Our goal will be to lose 2-3 lbs per month while on adipex.

## 2024-10-15 NOTE — Assessment & Plan Note (Signed)
 We will recheck her HgBA1c at this time.  We will adjust her meds as needed.

## 2024-10-15 NOTE — Progress Notes (Signed)
 Office Visit  Subjective   Patient ID: Cassandra Schmidt   DOB: 07/06/1966   Age: 58 y.o.   MRN: [id number]   Chief Complaint Chief Complaint  Patient presents with   Annual Exam    Fasting CPE     History of Present Illness The patient is a 58 year old Caucasian/White female who returns today for followup of her T2 diabetes.  On her last visit, her A1c was elevated and we increased her lantus  to 38 Units daily.  I saw her on 03/14/2023 and her HgBa1c was 8.1%.  At that time I started her on rybelsus  as opposed to ozempic where she had GI side effects from ozempic and mounjaro  in the past.  She also had GI side effects from rybelsus  with vomiting and diarrhea.  She stopped rybelsus  on 10/09/2023.  Again, mounjaro  and ozempic caused some abdominal pain.  We did refer her to GI who felt this was likely related to her IBS.  He wanted her to finish her trial of protonix BID then stop this medication and continue bentyl as needed and he added equilactin.  She remains on metformin  XR 2000mg  daily, glipizide  ER 10mg  BID, and Lantus  38 Units daily.   She denies any problems today.   She checks blood sugars at least once per day and they tend to range around 90-170.  Her last HgBa1c was done 3 months ago and was 9.1%.  She has no long term complications of diabetic nephropathy or retinopathy.  She has been followed for diabetic neuropathy of her hands and feet where we started her on gabapentin  in 2017.  This past year her neuropathy pain was not controlled and we tried her on lyrica.  This did not help and we therefore switched her back to gabapentin .  She is currently on gabapentin  from 800mg  BID.  We have tried her on Cymbalta 20mg  daily in the past.  She only took this for about 2 weeks but this causes some change in mood and she stopped it.   She was sent her for EMG/nerve conduction testing which was done on 01/01/2022 and this study of her bilateral lower extremities was within normal limits except for  isolated findings of mildly diminished sural sensory amplitudes which is a reflection of her peripheral neuropathy.  She did a yearly dilated diabetic eye exam by Dr. Vannie on 03/19/2024 and this showed no evidence of retinopathy.     Past Medical History Past Medical History:  Diagnosis Date   Anxiety    Arthritis    Depression    Diabetes mellitus without complication (HCC)    Type II   Headache    Pneumonia    06/01/16- greater- than 5 years ago   Stroke [facility])    no residual effects     Allergies Allergies  Allergen Reactions   Keflex [Cephalexin] Anaphylaxis    Throat swelling   Bee Venom Swelling   Penicillins Itching, Rash and Other (See Comments)    Patient with history of ANAPHYLAXIS with ORAL CEPHALOSPORINS  Has patient had a PCN reaction causing immediate rash, facial/tongue/throat swelling, SOB or lightheadedness with hypotension: Yes Has patient had a PCN reaction causing severe rash involving mucus membranes or skin necrosis: No Has patient had a PCN reaction that required hospitalization No Has patient had a PCN reaction occurring within the last 10 years: No If all of the above answers are NO, then may proceed with Cephalosporin use.   Iron Rash  Medications  Current Outpatient Medications:    ACCU-CHEK AVIVA PLUS test strip, USE AS DIRECTED, Disp: 100 strip, Rfl: 3   b complex vitamins tablet, Take 1 tablet by mouth daily., Disp: , Rfl:    EMBECTA PEN NEEDLE NANO 2 GEN 32G X 4 MM MISC, USE INJECT INSULIN  7 TIMES DAILY, Disp: 100 each, Rfl: 1   estradiol (ESTRACE) 1 MG tablet, Take 1 mg by mouth daily., Disp: , Rfl:    gabapentin  (NEURONTIN ) 800 MG tablet, TAKE 1 TABLET BY MOUTH 2 TIMES DAILY., Disp: 180 tablet, Rfl: 3   glipiZIDE  (GLUCOTROL ) 10 MG tablet, TAKE 1 TABLET BY MOUTH TWICE A DAY BEFORE MEALS, Disp: 180 tablet, Rfl: 1   insulin  glargine (LANTUS  SOLOSTAR) 100 UNIT/ML Solostar Pen, INJECT 0.07 MLS (7 UNITS TOTAL) INTO THE SKIN DAILY., Disp: 100  mL, Rfl: 11   Lancets (ONETOUCH DELICA PLUS LANCET33G) MISC, Check FSBS 3-4 times per day, Disp: 100 each, Rfl: 2   medroxyPROGESTERone (PROVERA) 2.5 MG tablet, Take 2.5 mg by mouth daily., Disp: , Rfl:    metFORMIN  (GLUCOPHAGE -XR) 500 MG 24 hr tablet, TAKE 2 TABLETS (1,000 MG TOTAL) BY MOUTH 2 (TWO) TIMES DAILY WITH A MEAL., Disp: 360 tablet, Rfl: 5   Multiple Vitamin (MULTIVITAMIN WITH MINERALS) TABS tablet, Take 1 tablet by mouth daily., Disp: , Rfl:    Review of Systems Review of Systems  Constitutional:  Negative for chills and fever.  Eyes:  Negative for blurred vision.  Respiratory:  Negative for shortness of breath.   Cardiovascular:  Negative for chest pain.  Gastrointestinal:  Negative for abdominal pain, constipation, diarrhea, nausea and vomiting.  Genitourinary:  Negative for frequency.  Neurological:  Negative for dizziness, weakness and headaches.  Endo/Heme/Allergies:  Negative for polydipsia.       Objective:    Vitals BP (!) 148/78   Pulse (!) 117   Temp 97.8 F (36.6 C) (Temporal)   Resp 16   Ht 5' 3 (1.6 m)   Wt 179 lb (81.2 kg)   SpO2 97%   BMI 31.71 kg/m    Physical Examination Physical Exam Constitutional:      Appearance: Normal appearance. She is not ill-appearing.  Cardiovascular:     Rate and Rhythm: Normal rate and regular rhythm.     Pulses: Normal pulses.     Heart sounds: No murmur heard.    No friction rub. No gallop.  Pulmonary:     Effort: Pulmonary effort is normal. No respiratory distress.     Breath sounds: No wheezing, rhonchi or rales.  Abdominal:     General: Bowel sounds are normal. There is no distension.     Palpations: Abdomen is soft.     Tenderness: There is no abdominal tenderness.  Musculoskeletal:     Right lower leg: No edema.     Left lower leg: No edema.  Skin:    General: Skin is warm and dry.     Findings: No rash.  Neurological:     Mental Status: She is alert.        Assessment & Plan:    Inadequately controlled diabetes mellitus (HCC) We will recheck her HgBA1c at this time.  We will adjust her meds as needed.  BMI 31.0-31.9,adult Dr. Wein had started her on adipex several months ago but the patient ran out about 2 months ago.  We will restart her on this and I want her to eat healthy and exercise.  Our goal will be to lose  2-3 lbs per month while on adipex.  Class 1 obesity due to excess calories with serious comorbidity and body mass index (BMI) of 31.0 to 31.9 in adult Plan as below.    Return in about 3 months (around 01/15/2025).   Selinda Fleeta Finger, MD

## 2024-10-16 LAB — HEMOGLOBIN A1C
Est. average glucose Bld gHb Est-mCnc: 209 mg/dL
Hgb A1c MFr Bld: 8.9 % — ABNORMAL HIGH (ref 4.8–5.6)

## 2024-10-17 ENCOUNTER — Other Ambulatory Visit: Payer: Self-pay | Admitting: Internal Medicine

## 2024-10-21 ENCOUNTER — Other Ambulatory Visit: Payer: Self-pay

## 2024-10-21 ENCOUNTER — Ambulatory Visit: Payer: Self-pay

## 2024-10-21 MED ORDER — GLIPIZIDE 10 MG PO TABS: 10.0000 mg | ORAL_TABLET | Freq: Two times a day (BID) | ORAL | 2 refills | Status: AC

## 2024-10-21 MED ORDER — EMBECTA PEN NEEDLE NANO 2 GEN 32G X 4 MM MISC: 1.0000 | Freq: Every day | 1 refills | Status: DC

## 2024-10-21 MED ORDER — NOVOLOG FLEXPEN 100 UNIT/ML ~~LOC~~ SOPN: 6.0000 [IU] | PEN_INJECTOR | Freq: Three times a day (TID) | SUBCUTANEOUS | 2 refills | Status: AC

## 2024-10-21 NOTE — Progress Notes (Signed)
 Patient called.  Patient aware.  Per Dr. Fleeta Finger Her diabetes is not controlled.  Increase her lantus  to 40 Units daily and start novolog  6 Units with her largest meal of the day.  I want her to start a blood sugar diary and check her FSBS 2-3 times per day over the next month.  I want some post prandial sugars in there (means after she eats).  Check her FSBS 1-2 hours after her largest meal of the day after taking novolog .  Make her an appointment in 1 month with Dr. Caleen or NP to go over this diary. Cassandra Schmidt

## 2024-10-21 NOTE — Progress Notes (Signed)
 New Rx per Dr. Fleeta Finger Novolog  6 units daily with largest meal of the day.

## 2024-11-02 ENCOUNTER — Other Ambulatory Visit: Payer: Self-pay

## 2024-11-02 MED ORDER — LANTUS SOLOSTAR 100 UNIT/ML ~~LOC~~ SOPN: 40.0000 [IU] | PEN_INJECTOR | Freq: Every day | SUBCUTANEOUS | 0 refills | Status: AC

## 2025-01-01 ENCOUNTER — Ambulatory Visit

## 2025-01-01 VITALS — BP 148/84 | HR 102 | Temp 98.4°F | Resp 16 | Ht 63.0 in | Wt 184.0 lb

## 2025-01-01 DIAGNOSIS — R519 Headache, unspecified: Secondary | ICD-10-CM | POA: Insufficient documentation

## 2025-01-01 DIAGNOSIS — R110 Nausea: Secondary | ICD-10-CM

## 2025-01-01 DIAGNOSIS — R112 Nausea with vomiting, unspecified: Secondary | ICD-10-CM | POA: Insufficient documentation

## 2025-01-01 MED ORDER — ONDANSETRON 4 MG PO TBDP: 4.0000 mg | ORAL_TABLET | Freq: Three times a day (TID) | ORAL | 0 refills | Status: AC | PRN

## 2025-01-01 NOTE — Progress Notes (Signed)
 "  Acute Office Visit  Subjective:     Patient ID: Cassandra Schmidt, female    DOB: 12/03/1965, 59 y.o.   MRN: [id number]  Chief Complaint  Patient presents with   Headache    Pt in today for a headache. The patient reports 2 weeks ago she started having headaches in the morning at night, states by the mid day the headache goes away and comes back at night. The patient reports she has been taking Tylenol  OTC with no relief.     Patient is a 59  year old patient who presents in office today for headaches that have continued since the past two weeks. She reports she gets headaches that at times eases off but comes back on strong that same days. She has taken Tylenol  but nothing else in that time. She has nausea and vomiting periodically in the past two weeks. She has a history of migraine headaches but they subsided when she changed her diet. The headache is triggered by light. The pain is located in the back of her head but when she goes into the office the light triggers pain behind her eyes. The pain does not radiate to any other location. She has a history of stroke 2016 and brain surgery in 2017. Patient is under a lot of stress at present time. She works two jobs, takes care of her unemployed 30 year old son and three dogs.  Headache  This is a recurrent problem. The current episode started 1 to 4 weeks ago. The problem occurs daily. The problem has been waxing and waning. The pain is located in the Retro-orbital and occipital region. The pain does not radiate. The pain quality is similar to prior headaches. The quality of the pain is described as throbbing. The pain is at a severity of 7/10. The pain is moderate. Associated symptoms include eye pain, nausea, photophobia, rhinorrhea, sinus pressure and vomiting. The symptoms are aggravated by bright light. She has tried acetaminophen , cold packs and darkened room for the symptoms. The treatment provided significant relief. Her past medical  history is significant for migraine headaches.     Review of Systems  Constitutional: Negative.   HENT:  Positive for congestion, rhinorrhea, sinus pressure and sinus pain.   Eyes:  Positive for photophobia and pain.  Respiratory: Negative.    Cardiovascular: Negative.   Gastrointestinal:  Positive for nausea and vomiting.  Genitourinary: Negative.   Musculoskeletal: Negative.   Skin: Negative.   Neurological:  Positive for headaches.  Endo/Heme/Allergies: Negative.   Psychiatric/Behavioral: Negative.          Objective:    BP (!) 148/84   Pulse (!) 102   Temp 98.4 F (36.9 C) (Temporal)   Resp 16   Ht 5' 3 (1.6 m)   Wt 184 lb (83.5 kg)   SpO2 96%   BMI 32.59 kg/m    Physical Exam Constitutional:      Appearance: She is well-developed.  HENT:     Mouth/Throat:     Mouth: Mucous membranes are moist.  Cardiovascular:     Heart sounds: Normal heart sounds.  Pulmonary:     Effort: Pulmonary effort is normal.     Breath sounds: Normal breath sounds.  Abdominal:     General: Bowel sounds are normal.     Palpations: Abdomen is soft.  Musculoskeletal:        General: Normal range of motion.     Cervical back: Normal range of motion.  Skin:    General: Skin is warm.  Neurological:     Mental Status: She is alert and oriented to person, place, and time.  Psychiatric:        Mood and Affect: Mood normal.        Speech: Speech normal.        Behavior: Behavior normal.     No results found for any visits on 01/01/25.      Assessment & Plan:   Problem List Items Addressed This Visit       Digestive   Nausea and vomiting   May take Zofran  4 mg every 8 hours as needed for nausea/vomiting.         Other   Headache around the eyes - Primary   Take Nurtec 75 mg ODT sample packet to see if this resolves the headaches. If they continue you will be referred to neurology.       No orders of the defined types were placed in this encounter.   Return in  about 4 weeks (around 01/29/2025).  Austine Cork, FNP   "

## 2025-01-01 NOTE — Assessment & Plan Note (Signed)
 May take Zofran  4 mg every 8 hours as needed for nausea/vomiting.

## 2025-01-01 NOTE — Assessment & Plan Note (Signed)
 Take Nurtec 75 mg ODT sample packet to see if this resolves the headaches. If they continue you will be referred to neurology.

## 2025-01-29 ENCOUNTER — Ambulatory Visit
